# Patient Record
Sex: Male | Born: 1960 | Race: White | Hispanic: No | Marital: Single | Smoking: Never smoker
Health system: Southern US, Community
[De-identification: ages and names within clinical notes are randomized; demographics above are authoritative.]

## PROBLEM LIST (undated history)

## (undated) DIAGNOSIS — K5900 Constipation, unspecified: Secondary | ICD-10-CM

## (undated) DIAGNOSIS — K649 Unspecified hemorrhoids: Secondary | ICD-10-CM

## (undated) DIAGNOSIS — E7800 Pure hypercholesterolemia, unspecified: Secondary | ICD-10-CM

## (undated) DIAGNOSIS — I10 Essential (primary) hypertension: Secondary | ICD-10-CM

## (undated) DIAGNOSIS — K219 Gastro-esophageal reflux disease without esophagitis: Secondary | ICD-10-CM

## (undated) DIAGNOSIS — Z789 Other specified health status: Secondary | ICD-10-CM

## (undated) DIAGNOSIS — F419 Anxiety disorder, unspecified: Secondary | ICD-10-CM

## (undated) HISTORY — DX: Essential (primary) hypertension: I10

## (undated) HISTORY — PX: MINOR HEMORRHOIDECTOMY: SHX6238

## (undated) HISTORY — DX: Constipation, unspecified: K59.00

## (undated) HISTORY — PX: OTHER SURGICAL HISTORY: SHX169

---

## 2001-07-25 ENCOUNTER — Other Ambulatory Visit: Admission: RE | Admit: 2001-07-25 | Discharge: 2001-07-25 | Payer: Self-pay | Admitting: General Surgery

## 2001-10-25 ENCOUNTER — Encounter: Payer: Self-pay | Admitting: Family Medicine

## 2001-10-25 ENCOUNTER — Ambulatory Visit (HOSPITAL_COMMUNITY): Admission: RE | Admit: 2001-10-25 | Discharge: 2001-10-25 | Payer: Self-pay | Admitting: Family Medicine

## 2012-11-08 ENCOUNTER — Ambulatory Visit (INDEPENDENT_AMBULATORY_CARE_PROVIDER_SITE_OTHER): Payer: BC Managed Care – PPO | Admitting: Urology

## 2012-11-08 DIAGNOSIS — N489 Disorder of penis, unspecified: Secondary | ICD-10-CM

## 2012-11-08 DIAGNOSIS — N320 Bladder-neck obstruction: Secondary | ICD-10-CM

## 2012-11-08 DIAGNOSIS — R972 Elevated prostate specific antigen [PSA]: Secondary | ICD-10-CM

## 2012-11-18 ENCOUNTER — Encounter (INDEPENDENT_AMBULATORY_CARE_PROVIDER_SITE_OTHER): Payer: Self-pay | Admitting: *Deleted

## 2012-11-23 ENCOUNTER — Ambulatory Visit (INDEPENDENT_AMBULATORY_CARE_PROVIDER_SITE_OTHER): Payer: BC Managed Care – PPO | Admitting: Internal Medicine

## 2012-11-23 ENCOUNTER — Encounter (INDEPENDENT_AMBULATORY_CARE_PROVIDER_SITE_OTHER): Payer: Self-pay | Admitting: Internal Medicine

## 2012-11-23 ENCOUNTER — Other Ambulatory Visit (INDEPENDENT_AMBULATORY_CARE_PROVIDER_SITE_OTHER): Payer: Self-pay | Admitting: *Deleted

## 2012-11-23 ENCOUNTER — Telehealth (INDEPENDENT_AMBULATORY_CARE_PROVIDER_SITE_OTHER): Payer: Self-pay | Admitting: *Deleted

## 2012-11-23 VITALS — BP 152/80 | HR 68 | Ht 72.0 in | Wt 218.2 lb

## 2012-11-23 DIAGNOSIS — I10 Essential (primary) hypertension: Secondary | ICD-10-CM

## 2012-11-23 DIAGNOSIS — K6289 Other specified diseases of anus and rectum: Secondary | ICD-10-CM

## 2012-11-23 DIAGNOSIS — K5900 Constipation, unspecified: Secondary | ICD-10-CM

## 2012-11-23 DIAGNOSIS — R198 Other specified symptoms and signs involving the digestive system and abdomen: Secondary | ICD-10-CM

## 2012-11-23 DIAGNOSIS — Z1211 Encounter for screening for malignant neoplasm of colon: Secondary | ICD-10-CM

## 2012-11-23 MED ORDER — PEG-KCL-NACL-NASULF-NA ASC-C 100 G PO SOLR: 1.0000 | Freq: Once | ORAL | Status: DC

## 2012-11-23 NOTE — Progress Notes (Signed)
Subjective:     Patient ID: Richard Kerr, male   DOB: 04/18/1961, 52 y.o.   MRN: [id number]  HPIReferred to our office by Dr. Regino Schultze for blood in his stools and constipation.  He tells me he has hemorrhoids. He thinks something is blocking him from having a BM.  He is having small BMs.  Symptoms x 2 months.  While in the office, he presented with a bag full of stool softners. His stools are hard. When he takes the Miralax, his stools are loose. He does have rectal pain when he has a BM. He usually has 1-2 times a day in small amounts. 10/13/2012: 16.3 and 45.4, platelet ct 186 Appetite is good. No weight loss. Weight gain of 5 pounds over last few months.  Review of Systems See hpi  Current Outpatient Prescriptions  Medication Sig Dispense Refill  . docusate sodium (COLACE) 100 MG capsule Take 100 mg by mouth 2 (two) times daily.      . hydrocortisone-pramoxine (PROCTOFOAM-HC) rectal foam Place 1 applicator rectally 2 (two) times daily.      . magnesium hydroxide (MILK OF MAGNESIA) 400 MG/5ML suspension Take by mouth daily as needed for constipation.      Marland Kitchen omeprazole (PRILOSEC) 40 MG capsule Take 40 mg by mouth daily.      . polyethylene glycol (MIRALAX / GLYCOLAX) packet Take 17 g by mouth daily.       No current facility-administered medications for this visit.   Past Medical History  Diagnosis Date  . Hypertension   . Constipation    Past Medical History  Diagnosis Date  . Hypertension   . Constipation    Past Surgical History  Procedure Laterality Date  . Removal of cyst      to back   Allergies no known allergies      Objective:   Physical Exam  Filed Vitals:   11/23/12 0950  BP: 152/80  Pulse: 68  Height: 6' (1.829 m)  Weight: 218 lb 3.2 oz (98.975 kg)  Alert and oriented. Skin warm and dry. Oral mucosa is moist.   . Sclera anicteric, conjunctivae is pink. Thyroid not enlarged. No cervical lymphadenopathy. Lungs clear. Heart regular rate and rhythm.   Abdomen is soft. Bowel sounds are positive. No hepatomegaly. No abdominal masses felt. No tenderness.  No edema to lower extremities.        Assessment:    Constipation. Change in his stools. Rectal mass. Colonic neoplasm needs to be ruled out. Patient also has exernal hemorrhoids.    Plan:    Colonoscopy.  The risks and benefits such as perforation, bleeding, and infection were reviewed with the patient and is agreeable.

## 2012-11-23 NOTE — Telephone Encounter (Signed)
Patient needs movi prep 

## 2012-11-23 NOTE — Patient Instructions (Signed)
Colonoscopy with Dr. Rehman 

## 2012-11-30 ENCOUNTER — Encounter (INDEPENDENT_AMBULATORY_CARE_PROVIDER_SITE_OTHER): Payer: Self-pay

## 2012-12-02 ENCOUNTER — Ambulatory Visit (HOSPITAL_COMMUNITY)
Admission: RE | Admit: 2012-12-02 | Discharge: 2012-12-02 | Disposition: A | Discharge status: Home / Self Care | Payer: BC Managed Care – PPO | Source: Ambulatory Visit | Attending: Internal Medicine | Admitting: Internal Medicine

## 2012-12-02 ENCOUNTER — Encounter (HOSPITAL_COMMUNITY)
Admission: RE | Disposition: A | Discharge status: Home / Self Care | Payer: Self-pay | Source: Ambulatory Visit | Attending: Internal Medicine

## 2012-12-02 ENCOUNTER — Encounter (HOSPITAL_COMMUNITY): Payer: Self-pay | Admitting: *Deleted

## 2012-12-02 DIAGNOSIS — K644 Residual hemorrhoidal skin tags: Secondary | ICD-10-CM

## 2012-12-02 DIAGNOSIS — I10 Essential (primary) hypertension: Secondary | ICD-10-CM | POA: Insufficient documentation

## 2012-12-02 DIAGNOSIS — K921 Melena: Secondary | ICD-10-CM | POA: Insufficient documentation

## 2012-12-02 DIAGNOSIS — K625 Hemorrhage of anus and rectum: Secondary | ICD-10-CM

## 2012-12-02 DIAGNOSIS — K6289 Other specified diseases of anus and rectum: Secondary | ICD-10-CM

## 2012-12-02 DIAGNOSIS — K5900 Constipation, unspecified: Secondary | ICD-10-CM

## 2012-12-02 DIAGNOSIS — K602 Anal fissure, unspecified: Secondary | ICD-10-CM

## 2012-12-02 HISTORY — PX: COLONOSCOPY: SHX5424

## 2012-12-02 SURGERY — COLONOSCOPY
Anesthesia: Moderate Sedation

## 2012-12-02 MED ORDER — MEPERIDINE HCL 50 MG/ML IJ SOLN
INTRAMUSCULAR | Status: AC
  Filled 2012-12-02: qty 1

## 2012-12-02 MED ORDER — MEPERIDINE HCL 50 MG/ML IJ SOLN
INTRAMUSCULAR | Status: DC | PRN
  Administered 2012-12-02 (×2): 25 mg via INTRAVENOUS

## 2012-12-02 MED ORDER — SODIUM CHLORIDE 0.9 % IV SOLN
INTRAVENOUS | Status: DC
  Administered 2012-12-02: 1000 mL via INTRAVENOUS

## 2012-12-02 MED ORDER — MIDAZOLAM HCL 5 MG/5ML IJ SOLN
INTRAMUSCULAR | Status: DC | PRN
  Administered 2012-12-02 (×5): 2 mg via INTRAVENOUS

## 2012-12-02 MED ORDER — NITROGLYCERIN 0.3 MG SL SUBL: 0.3000 mg | SUBLINGUAL_TABLET | SUBLINGUAL | Status: DC | PRN

## 2012-12-02 MED ORDER — STERILE WATER FOR IRRIGATION IR SOLN
Status: DC | PRN
  Administered 2012-12-02: 13:00:00

## 2012-12-02 MED ORDER — PSYLLIUM 28 % PO PACK: 1.0000 | PACK | Freq: Every day | ORAL | Status: DC

## 2012-12-02 MED ORDER — DILTIAZEM GEL 2 %: 1.0000 "application " | Freq: Two times a day (BID) | CUTANEOUS | Status: DC

## 2012-12-02 MED ORDER — MIDAZOLAM HCL 5 MG/5ML IJ SOLN
INTRAMUSCULAR | Status: AC
  Filled 2012-12-02: qty 10

## 2012-12-02 NOTE — Op Note (Signed)
COLONOSCOPY PROCEDURE REPORT  PATIENT:  Richard Kerr  MR#:  [id number] Birthdate:  Dec 04, 1960, 52 y.o., male Endoscopist:  Dr. Malissa Hippo, MD Referred By:  Dr. Kirk Ruths, MD Procedure Date: 12/02/2012  Procedure:   Colonoscopy  Indications:  Patient is 52 year old Caucasian male who presents with progressive constipation and hematochezia. She also complains of bloating and intermittent chest pain. In preop he was noted to have frequent PVCs and bigeminy.  Informed Consent:  The procedure and risks were reviewed with the patient and informed consent was obtained.  Medications:  Demerol 50 mg IV Versed 10 mg IV  Description of procedure:  After a digital rectal exam was performed, that colonoscope was advanced from the anus through the rectum and colon to the area of the cecum, ileocecal valve and appendiceal orifice. The cecum was deeply intubated. These structures were well-seen and photographed for the record. From the level of the cecum and ileocecal valve, the scope was slowly and cautiously withdrawn. The mucosal surfaces were carefully surveyed utilizing scope tip to flexion to facilitate fold flattening as needed. The scope was pulled down into the rectum where a thorough exam including retroflexion was performed.  Findings:   Prep excellent. Normal mucosa of the colon. Normal rectum mucosa. Almond and hemorrhoids below the dentate line along with linear ulcer involving the anal canal. Increased anal sphincter tone.    Therapeutic/Diagnostic Maneuvers Performed:  None  Complications:  None  Cecal Withdrawal Time:  10 minutes  Impression:  Examination performed to cecum. Moderate size external hemorrhoids. Anal fissure. No evidence of rectal mass or polyps.  Frequent PVCs noted during preop evaluation as well as during the procedure.  Recommendations:  Standard instructions given. Diltiazem gel 2% one application in canal twice a day for 4  weeks. Continue high fiber diet and polyethylene glycol as before. Metamucil 4 g by mouth each bedtime. Nitroglycerin 0.3 mg sublingual when necessary chest pain. Patient's condition discussed with  Dr. Karleen Hampshire recommended cardiology consultation with Dr. Susa Griffins. If patient does not respond to medical therapy he may need internal sphincterotomy for anal fissure. Office visit in 4 weeks.  REHMAN,NAJEEB U  12/02/2012 1:42 PM  CC: Dr. Kirk Ruths, MD & Dr. Bonnetta Barry ref. provider found CC Dr. Susa Griffins, MD

## 2012-12-02 NOTE — H&P (Signed)
Richard Kerr is an 52 y.o. male.   Chief Complaint: Patient is here for colonoscopy. HPI: Patient is 52 year old Caucasian male who presents with several year history of constipation and increasing difficulty in the last 3 months. He also has noted intermittent hematochezia. He believes he has hemorrhoids. He also has noted abdominal bloating. He's been having intermittent chest pain. Has been on PPI Dr. Regino Schultze who thought he may be refluxing. He has good appetite. He has not lost any weight. In fact an ischemic component. CBC 6 weeks ago was within normal limits. Family history is negative for colorectal carcinoma.  Past Medical History  Diagnosis Date  . Hypertension   . Constipation     Past Surgical History  Procedure Laterality Date  . Removal of cyst      to back    History reviewed. No pertinent family history. Social History:  reports that he has never smoked. He does not have any smokeless tobacco history on file. He reports that he does not drink alcohol or use illicit drugs.  Allergies: No Known Allergies  Medications Prior to Admission  Medication Sig Dispense Refill  . docusate sodium (COLACE) 100 MG capsule Take 100 mg by mouth 2 (two) times daily.      . magnesium hydroxide (MILK OF MAGNESIA) 400 MG/5ML suspension Take by mouth daily as needed for constipation.      Marland Kitchen omeprazole (PRILOSEC) 40 MG capsule Take 40 mg by mouth daily.      . peg 3350 powder (MOVIPREP) 100 G SOLR Take 1 kit (100 g total) by mouth once.  1 kit  0  . polyethylene glycol (MIRALAX / GLYCOLAX) packet Take 17 g by mouth daily.      . hydrocortisone-pramoxine (PROCTOFOAM-HC) rectal foam Place 1 applicator rectally 2 (two) times daily.        No results found for this or any previous visit (from the past 48 hour(s)). No results found.  ROS  Blood pressure 135/50, pulse 102, temperature 98.6 F (37 C), temperature source Oral, resp. rate 22, height 6' (1.829 m), weight 218 lb (98.884 kg),  SpO2 98.00%. Physical Exam  Constitutional: He appears well-developed and well-nourished.  HENT:  Mouth/Throat: Oropharynx is clear and moist.  Eyes: Conjunctivae are normal. No scleral icterus.  Neck: No thyromegaly present.  Cardiovascular:  Irregular rhythm normal S1 and S2. No murmur or gallop noted. Monitoring reveals  single PVCs  Respiratory: Effort normal and breath sounds normal.  GI: Soft. He exhibits no distension and no mass. There is no tenderness.  Musculoskeletal: He exhibits no edema.  Lymphadenopathy:    He has no cervical adenopathy.  Neurological: He is alert.  Skin: Skin is warm and dry.     Assessment/Plan Constipation and rectal bleeding. Diagnostic colonoscopy.  Matie Dimaano U 12/02/2012, 12:57 PM

## 2012-12-06 ENCOUNTER — Other Ambulatory Visit (HOSPITAL_COMMUNITY): Payer: Self-pay | Admitting: Cardiovascular Disease

## 2012-12-06 ENCOUNTER — Encounter (HOSPITAL_COMMUNITY): Payer: Self-pay | Admitting: Internal Medicine

## 2012-12-06 DIAGNOSIS — I493 Ventricular premature depolarization: Secondary | ICD-10-CM

## 2012-12-06 DIAGNOSIS — R011 Cardiac murmur, unspecified: Secondary | ICD-10-CM

## 2012-12-13 ENCOUNTER — Ambulatory Visit (HOSPITAL_COMMUNITY)
Admission: RE | Admit: 2012-12-13 | Discharge: 2012-12-13 | Disposition: A | Discharge status: Home / Self Care | Payer: BC Managed Care – PPO | Source: Ambulatory Visit | Attending: Cardiovascular Disease | Admitting: Cardiovascular Disease

## 2012-12-13 DIAGNOSIS — R011 Cardiac murmur, unspecified: Secondary | ICD-10-CM | POA: Insufficient documentation

## 2012-12-13 DIAGNOSIS — R079 Chest pain, unspecified: Secondary | ICD-10-CM | POA: Insufficient documentation

## 2012-12-13 DIAGNOSIS — I498 Other specified cardiac arrhythmias: Secondary | ICD-10-CM | POA: Insufficient documentation

## 2012-12-13 DIAGNOSIS — I4949 Other premature depolarization: Secondary | ICD-10-CM | POA: Insufficient documentation

## 2012-12-13 DIAGNOSIS — I493 Ventricular premature depolarization: Secondary | ICD-10-CM

## 2012-12-13 NOTE — Progress Notes (Signed)
2D Echo Performed 12/13/2012    Clearence Ped, RCS

## 2012-12-23 ENCOUNTER — Other Ambulatory Visit: Payer: Self-pay | Admitting: Urology

## 2012-12-23 DIAGNOSIS — R972 Elevated prostate specific antigen [PSA]: Secondary | ICD-10-CM

## 2012-12-26 ENCOUNTER — Encounter (INDEPENDENT_AMBULATORY_CARE_PROVIDER_SITE_OTHER): Payer: Self-pay | Admitting: Internal Medicine

## 2012-12-26 ENCOUNTER — Encounter: Payer: Self-pay | Admitting: Cardiovascular Disease

## 2012-12-26 ENCOUNTER — Encounter (HOSPITAL_COMMUNITY): Payer: Self-pay | Admitting: Cardiovascular Disease

## 2012-12-26 ENCOUNTER — Other Ambulatory Visit (HOSPITAL_COMMUNITY): Payer: Self-pay | Admitting: Cardiovascular Disease

## 2012-12-26 ENCOUNTER — Ambulatory Visit (INDEPENDENT_AMBULATORY_CARE_PROVIDER_SITE_OTHER): Payer: BC Managed Care – PPO | Admitting: Internal Medicine

## 2012-12-26 VITALS — BP 134/70 | HR 42 | Temp 99.3°F | Ht 72.0 in | Wt 216.5 lb

## 2012-12-26 DIAGNOSIS — K602 Anal fissure, unspecified: Secondary | ICD-10-CM

## 2012-12-26 DIAGNOSIS — R011 Cardiac murmur, unspecified: Secondary | ICD-10-CM

## 2012-12-26 DIAGNOSIS — I493 Ventricular premature depolarization: Secondary | ICD-10-CM

## 2012-12-26 NOTE — Progress Notes (Signed)
Subjective:     Patient ID: Richard Kerr, male   DOB: June 11, 1961, 52 y.o.   MRN: [id number]  HPI last seen in April for constipation and possible rectal mass which was actually an rectal fissure. He had had symptoms x 2 months. Underwent a colonoscopy and found to have a anal fissure. He tells me he still has the symptoms of constipation. He says he has a blockage.  He has been taking the Diltiazem gel 2% 1-2 times a day.  He tells me he feels like he is blocked. There is some rectal bleeding. Stools are small.  He still has tenderness in his rectum.  He is not using Miralax or Metamucil as recommended by Dr. Karilyn Cota  No weight loss.  Appetite is good.  Presently taking Metoprolol for PVCs noted during his colonoscopy. Sees Dr. Alanda Amass.     11/2012 Colonoscopy: Impression:  Examination performed to cecum.  Moderate size external hemorrhoids.  Anal fissure.  No evidence of rectal mass or polyps.  Frequent PVCs noted during preop evaluation as well as during the procedure.  Recommendations:  Standard instructions given.  Diltiazem gel 2% one application in canal twice a day for 4 weeks.  Continue high fiber diet and polyethylene glycol as before.  Metamucil 4 g by mouth each bedtime.  Nitroglycerin 0.3 mg sublingual when necessary chest pain.  Patient's condition discussed with Dr. Karleen Hampshire recommended cardiology consultation with Dr. Susa Griffins.  If patient does not respond to medical therapy he may need internal sphincterotomy for anal fissure.  Office visit in 4 weeks.   Review of Systems See hpi Current Outpatient Prescriptions  Medication Sig Dispense Refill  . diltiazem 2 % GEL Apply 1 application topically 2 (two) times daily.  60 g  1  . metoprolol tartrate (LOPRESSOR) 25 MG tablet Take 25 mg by mouth daily.      . nitroGLYCERIN (NITROSTAT) 0.3 MG SL tablet Place 1 tablet (0.3 mg total) under the tongue every 5 (five) minutes as needed for chest pain.  100  tablet  0  . simvastatin (ZOCOR) 20 MG tablet Take 20 mg by mouth every evening.       No current facility-administered medications for this visit.   Past Medical History  Diagnosis Date  . Hypertension   . Constipation        Objective:   Physical Exam  Filed Vitals:   12/26/12 1403  BP: 134/70  Pulse: 42  Temp: 99.3 F (37.4 C)  Height: 6' (1.829 m)  Weight: 216 lb 8 oz (98.204 kg)   Alert and oriented. Skin warm and dry. Oral mucosa is moist.   . Sclera anicteric, conjunctivae is pink. Thyroid not enlarged. No cervical lymphadenopathy. Lungs clear. Heart regular rate and rhythm.  Abdomen is soft. Bowel sounds are positive. No hepatomegaly. No abdominal masses felt. No tenderness.  No edema to lower extremities. Rectal exam: noted to have tight anal sphincter. No anal fissure felt.     Assessment:    Anal fissure: hopefully healed. Constipation    Plan:    Referral to Dr. Malvin Johns for possible sphincterotomy. Start the Miralax 1 scoop and Metamucil 4gms dailly.

## 2012-12-26 NOTE — Patient Instructions (Addendum)
Miralax 1 scoop daily. Metamucil 4gm daily.

## 2012-12-27 ENCOUNTER — Encounter: Payer: Self-pay | Admitting: Cardiovascular Disease

## 2013-01-03 ENCOUNTER — Other Ambulatory Visit: Payer: Self-pay | Admitting: Urology

## 2013-01-03 ENCOUNTER — Ambulatory Visit (HOSPITAL_COMMUNITY)
Admission: RE | Admit: 2013-01-03 | Discharge: 2013-01-03 | Disposition: A | Discharge status: Home / Self Care | Payer: BC Managed Care – PPO | Source: Ambulatory Visit | Attending: Urology | Admitting: Urology

## 2013-01-03 DIAGNOSIS — R972 Elevated prostate specific antigen [PSA]: Secondary | ICD-10-CM

## 2013-01-03 MED ORDER — LIDOCAINE HCL (PF) 2 % IJ SOLN
INTRAMUSCULAR | Status: AC
  Filled 2013-01-03: qty 10

## 2013-01-03 MED ORDER — GENTAMICIN SULFATE 40 MG/ML IJ SOLN
160.0000 mg | Freq: Once | INTRAMUSCULAR | Status: DC
  Filled 2013-01-03: qty 4

## 2013-01-03 NOTE — Progress Notes (Signed)
Gentamicin 160mg  given IM in left deltoid. Band aid appiled.

## 2013-01-03 NOTE — Progress Notes (Signed)
Lidocaine 2%       10mL injected                       Transrectal prostate biopsies performed 

## 2013-01-05 ENCOUNTER — Ambulatory Visit (HOSPITAL_COMMUNITY)
Admission: RE | Admit: 2013-01-05 | Discharge: 2013-01-05 | Disposition: A | Discharge status: Home / Self Care | Payer: BC Managed Care – PPO | Source: Ambulatory Visit | Attending: Cardiovascular Disease | Admitting: Cardiovascular Disease

## 2013-01-05 DIAGNOSIS — I4949 Other premature depolarization: Secondary | ICD-10-CM | POA: Insufficient documentation

## 2013-01-05 DIAGNOSIS — I493 Ventricular premature depolarization: Secondary | ICD-10-CM

## 2013-01-05 DIAGNOSIS — R011 Cardiac murmur, unspecified: Secondary | ICD-10-CM | POA: Insufficient documentation

## 2013-01-11 ENCOUNTER — Encounter: Payer: Self-pay | Admitting: Urology

## 2013-01-26 ENCOUNTER — Other Ambulatory Visit: Payer: Self-pay | Admitting: Urology

## 2013-02-14 ENCOUNTER — Encounter (HOSPITAL_COMMUNITY): Payer: Self-pay

## 2013-02-14 ENCOUNTER — Encounter (HOSPITAL_COMMUNITY)
Admission: RE | Admit: 2013-02-14 | Discharge: 2013-02-14 | Disposition: A | Discharge status: Home / Self Care | Payer: BC Managed Care – PPO | Source: Ambulatory Visit | Attending: Urology | Admitting: Urology

## 2013-02-14 HISTORY — DX: Pure hypercholesterolemia, unspecified: E78.00

## 2013-02-14 HISTORY — DX: Unspecified hemorrhoids: K64.9

## 2013-02-14 HISTORY — DX: Gastro-esophageal reflux disease without esophagitis: K21.9

## 2013-02-14 HISTORY — DX: Other specified health status: Z78.9

## 2013-02-14 LAB — HEMOGLOBIN AND HEMATOCRIT, BLOOD: Hemoglobin: 15.9 g/dL (ref 13.0–17.0)

## 2013-02-14 LAB — BASIC METABOLIC PANEL
BUN: 13 mg/dL (ref 6–23)
Chloride: 106 mEq/L (ref 96–112)
Creatinine, Ser: 1.2 mg/dL (ref 0.50–1.35)
GFR calc Af Amer: 79 mL/min — ABNORMAL LOW (ref >90)
GFR calc non Af Amer: 68 mL/min — ABNORMAL LOW (ref >90)

## 2013-02-14 NOTE — Patient Instructions (Addendum)
JOVEN MOM  02/14/2013   Your procedure is scheduled on:  Tuesday, 02/21/13  Report to [facility] at 1045 AM.  Call this number if you have problems the morning of surgery: [phone]   Remember:   Do not eat food or drink liquids after midnight.   Take these medicines the morning of surgery with A SIP OF WATER: metoprolol   Do not wear jewelry, make-up or nail polish.  Do not wear lotions, powders, or perfumes. You may wear deodorant.  Do not shave 48 hours prior to surgery. Men may shave face and neck.  Do not bring valuables to the hospital.  [facility] is not responsible                   for any belongings or valuables.  Contacts, dentures or bridgework may not be worn into surgery.  Leave suitcase in the car. After surgery it may be brought to your room.  For patients admitted to the hospital, checkout time is 11:00 AM the day of  discharge.   Patients discharged the day of surgery will not be allowed to drive  home.  Name and phone number of your driver: family  Special Instructions: Shower using CHG 2 nights before surgery and the night before surgery.  If you shower the day of surgery use CHG.  Use special wash - you have one bottle of CHG for all showers.  You should use approximately 1/3 of the bottle for each shower.   Please read over the following fact sheets that you were given: Anesthesia Post-op Instructions   Biopsy Care After Refer to this sheet in the next few weeks. These instructions provide you with information on caring for yourself after your procedure. Your caregiver may also give you more specific instructions. Your treatment has been planned according to current medical practices, but problems sometimes occur. Call your caregiver if you have any problems or questions after your procedure. If you had a fine needle biopsy, you may have soreness at the biopsy site for 1 to 2 days. If you had an open biopsy, you may have soreness at the biopsy site for 3 to 4  days. HOME CARE INSTRUCTIONS   You may resume normal diet and activities as directed.  Change bandages (dressings) as directed. If your wound was closed with a skin glue (adhesive), it will wear off and begin to peel in 7 days.  Only take over-the-counter or prescription medicines for pain, discomfort, or fever as directed by your caregiver.  Ask your caregiver when you can bathe and get your wound wet. SEEK IMMEDIATE MEDICAL CARE IF:   You have increased bleeding (more than a small spot) from the biopsy site.  You notice redness, swelling, or increasing pain at the biopsy site.  You have pus coming from the biopsy site.  You have a fever.  You notice a bad smell coming from the biopsy site or dressing.  You have a rash, have difficulty breathing, or have any allergic problems. MAKE SURE YOU:   Understand these instructions.  Will watch your condition.  Will get help right away if you are not doing well or get worse. Document Released: 01/30/2005 Document Revised: 10/05/2011 Document Reviewed: 01/08/2011 [facility] Patient Information 2014 [place], [place]. PATIENT INSTRUCTIONS POST-ANESTHESIA  IMMEDIATELY FOLLOWING SURGERY:  Do not drive or operate machinery for the first twenty four hours after surgery.  Do not make any important decisions for twenty four hours after surgery or while  taking narcotic pain medications or sedatives.  If you develop intractable nausea and vomiting or a severe headache please notify your doctor immediately.  FOLLOW-UP:  Please make an appointment with your surgeon as instructed. You do not need to follow up with anesthesia unless specifically instructed to do so.  WOUND CARE INSTRUCTIONS (if applicable):  Keep a dry clean dressing on the anesthesia/puncture wound site if there is drainage.  Once the wound has quit draining you may leave it open to air.  Generally you should leave the bandage intact for twenty four hours unless there is drainage.  If  the epidural site drains for more than 36-48 hours please call the anesthesia department.  QUESTIONS?:  Please feel free to call your physician or the hospital operator if you have any questions, and they will be happy to assist you.

## 2013-02-21 ENCOUNTER — Ambulatory Visit (HOSPITAL_COMMUNITY)
Admission: RE | Admit: 2013-02-21 | Discharge: 2013-02-21 | Disposition: A | Discharge status: Home / Self Care | Payer: BC Managed Care – PPO | Source: Ambulatory Visit | Attending: Urology | Admitting: Urology

## 2013-02-21 ENCOUNTER — Encounter (HOSPITAL_COMMUNITY): Payer: Self-pay | Admitting: Anesthesiology

## 2013-02-21 ENCOUNTER — Encounter (HOSPITAL_COMMUNITY): Payer: Self-pay | Admitting: *Deleted

## 2013-02-21 ENCOUNTER — Ambulatory Visit (HOSPITAL_COMMUNITY): Payer: BC Managed Care – PPO | Admitting: Anesthesiology

## 2013-02-21 ENCOUNTER — Encounter (HOSPITAL_COMMUNITY)
Admission: RE | Disposition: A | Discharge status: Home / Self Care | Payer: Self-pay | Source: Ambulatory Visit | Attending: Urology

## 2013-02-21 DIAGNOSIS — A630 Anogenital (venereal) warts: Secondary | ICD-10-CM | POA: Insufficient documentation

## 2013-02-21 DIAGNOSIS — K5900 Constipation, unspecified: Secondary | ICD-10-CM | POA: Insufficient documentation

## 2013-02-21 DIAGNOSIS — N489 Disorder of penis, unspecified: Secondary | ICD-10-CM

## 2013-02-21 DIAGNOSIS — I10 Essential (primary) hypertension: Secondary | ICD-10-CM | POA: Insufficient documentation

## 2013-02-21 DIAGNOSIS — N508 Other specified disorders of male genital organs: Secondary | ICD-10-CM | POA: Insufficient documentation

## 2013-02-21 DIAGNOSIS — L989 Disorder of the skin and subcutaneous tissue, unspecified: Secondary | ICD-10-CM | POA: Insufficient documentation

## 2013-02-21 DIAGNOSIS — K649 Unspecified hemorrhoids: Secondary | ICD-10-CM | POA: Insufficient documentation

## 2013-02-21 DIAGNOSIS — K219 Gastro-esophageal reflux disease without esophagitis: Secondary | ICD-10-CM | POA: Insufficient documentation

## 2013-02-21 DIAGNOSIS — N423 Unspecified dysplasia of prostate: Secondary | ICD-10-CM | POA: Insufficient documentation

## 2013-02-21 DIAGNOSIS — E7800 Pure hypercholesterolemia, unspecified: Secondary | ICD-10-CM | POA: Insufficient documentation

## 2013-02-21 HISTORY — PX: PENILE BIOPSY: SHX6013

## 2013-02-21 SURGERY — BIOPSY, PENIS
Anesthesia: Monitor Anesthesia Care | Site: Penis | Wound class: Clean

## 2013-02-21 MED ORDER — CEFAZOLIN SODIUM 1-5 GM-% IV SOLN
INTRAVENOUS | Status: AC
  Filled 2013-02-21: qty 50

## 2013-02-21 MED ORDER — 0.9 % SODIUM CHLORIDE (POUR BTL) OPTIME
TOPICAL | Status: DC | PRN
  Administered 2013-02-21: 500 mL

## 2013-02-21 MED ORDER — LIDOCAINE HCL 1 % IJ SOLN
INTRAMUSCULAR | Status: DC | PRN
  Administered 2013-02-21: 6.5 mL

## 2013-02-21 MED ORDER — FENTANYL CITRATE 0.05 MG/ML IJ SOLN
INTRAMUSCULAR | Status: AC
  Filled 2013-02-21: qty 2

## 2013-02-21 MED ORDER — MIDAZOLAM HCL 2 MG/2ML IJ SOLN
1.0000 mg | INTRAMUSCULAR | Status: DC | PRN
  Administered 2013-02-21: 2 mg via INTRAVENOUS

## 2013-02-21 MED ORDER — LIDOCAINE HCL (PF) 1 % IJ SOLN
INTRAMUSCULAR | Status: AC
  Filled 2013-02-21: qty 5

## 2013-02-21 MED ORDER — LACTATED RINGERS IV SOLN
INTRAVENOUS | Status: DC
  Administered 2013-02-21: 11:00:00 via INTRAVENOUS

## 2013-02-21 MED ORDER — HYDROCODONE-ACETAMINOPHEN 5-325 MG PO TABS: 1.0000 | ORAL_TABLET | ORAL | Status: DC | PRN

## 2013-02-21 MED ORDER — BUPIVACAINE HCL (PF) 0.25 % IJ SOLN
INTRAMUSCULAR | Status: AC
  Filled 2013-02-21: qty 30

## 2013-02-21 MED ORDER — FENTANYL CITRATE 0.05 MG/ML IJ SOLN: 25.0000 ug | INTRAMUSCULAR | Status: DC | PRN

## 2013-02-21 MED ORDER — MIDAZOLAM HCL 5 MG/5ML IJ SOLN
INTRAMUSCULAR | Status: DC | PRN
  Administered 2013-02-21: 2 mg via INTRAVENOUS

## 2013-02-21 MED ORDER — PROPOFOL INFUSION 10 MG/ML OPTIME
INTRAVENOUS | Status: DC | PRN
  Administered 2013-02-21: 125 ug/kg/min via INTRAVENOUS

## 2013-02-21 MED ORDER — MIDAZOLAM HCL 2 MG/2ML IJ SOLN
INTRAMUSCULAR | Status: AC
  Filled 2013-02-21: qty 2

## 2013-02-21 MED ORDER — FENTANYL CITRATE 0.05 MG/ML IJ SOLN
25.0000 ug | INTRAMUSCULAR | Status: AC
  Administered 2013-02-21 (×2): 25 ug via INTRAVENOUS

## 2013-02-21 MED ORDER — CEFAZOLIN SODIUM 1-5 GM-% IV SOLN
1.0000 g | INTRAVENOUS | Status: AC
  Administered 2013-02-21: 1 g via INTRAVENOUS

## 2013-02-21 MED ORDER — PROPOFOL 10 MG/ML IV EMUL
INTRAVENOUS | Status: AC
  Filled 2013-02-21: qty 20

## 2013-02-21 MED ORDER — LACTATED RINGERS IV SOLN
INTRAVENOUS | Status: DC | PRN
  Administered 2013-02-21: 11:00:00 via INTRAVENOUS

## 2013-02-21 MED ORDER — FENTANYL CITRATE 0.05 MG/ML IJ SOLN
INTRAMUSCULAR | Status: DC | PRN
  Administered 2013-02-21 (×2): 25 ug via INTRAVENOUS

## 2013-02-21 MED ORDER — ONDANSETRON HCL 4 MG/2ML IJ SOLN: 4.0000 mg | Freq: Once | INTRAMUSCULAR | Status: DC | PRN

## 2013-02-21 MED ORDER — LIDOCAINE HCL (PF) 1 % IJ SOLN
INTRAMUSCULAR | Status: AC
  Filled 2013-02-21: qty 30

## 2013-02-21 MED ORDER — BUPIVACAINE HCL 0.25 % IJ SOLN
INTRAMUSCULAR | Status: DC | PRN
  Administered 2013-02-21: 6.5 mL

## 2013-02-21 SURGICAL SUPPLY — 26 items
BAG HAMPER (MISCELLANEOUS) ×2 IMPLANT
BANDAGE CONFORM 2  STR LF (GAUZE/BANDAGES/DRESSINGS) ×2 IMPLANT
BLADE SURG 15 STRL LF DISP TIS (BLADE) ×1 IMPLANT
BLADE SURG 15 STRL SS (BLADE) ×1
CLOTH BEACON ORANGE TIMEOUT ST (SAFETY) ×2 IMPLANT
ELECT NEEDLE TIP 2.8 STRL (NEEDLE) IMPLANT
ELECT REM PT RETURN 9FT ADLT (ELECTROSURGICAL) ×2
ELECTRODE REM PT RTRN 9FT ADLT (ELECTROSURGICAL) ×1 IMPLANT
GAUZE KERLIX 2  STERILE LF (GAUZE/BANDAGES/DRESSINGS) IMPLANT
GAUZE PETROLATUM 1 X8 (GAUZE/BANDAGES/DRESSINGS) ×2 IMPLANT
GLOVE BIO SURGEON STRL SZ8 (GLOVE) ×2 IMPLANT
GLOVE EXAM NITRILE MD LF STRL (GLOVE) ×2 IMPLANT
GLOVE INDICATOR 7.0 STRL GRN (GLOVE) ×2 IMPLANT
GLOVE SS BIOGEL STRL SZ 6.5 (GLOVE) ×1 IMPLANT
GLOVE SUPERSENSE BIOGEL SZ 6.5 (GLOVE) ×1
GOWN STRL REIN XL XLG (GOWN DISPOSABLE) ×4 IMPLANT
KIT ROOM TURNOVER APOR (KITS) ×2 IMPLANT
MANIFOLD NEPTUNE II (INSTRUMENTS) ×2 IMPLANT
NEEDLE HYPO 22GX1.5 SAFETY (NEEDLE) ×2 IMPLANT
NEEDLE HYPO 25X5/8 SAFETYGLIDE (NEEDLE) IMPLANT
NS IRRIG 500ML POUR BTL (IV SOLUTION) ×2 IMPLANT
PACK MINOR (CUSTOM PROCEDURE TRAY) ×2 IMPLANT
SET BASIN LINEN APH (SET/KITS/TRAYS/PACK) ×2 IMPLANT
SUT CHROMIC 4 0 RB 1X27 (SUTURE) ×2 IMPLANT
SUT CHROMIC 5 0 RB 1 27 (SUTURE) IMPLANT
SYR CONTROL 10ML LL (SYRINGE) ×2 IMPLANT

## 2013-02-21 NOTE — Op Note (Signed)
Preoperative diagnosis: Penile lesion, 8 mm  Postoperative diagnosis: Same   Procedure: Excision of he now lesion    Surgeon: Bertram Millard. Claudie Brickhouse, M.D.   Anesthesia: Local with sedation  Complications: None  Specimen(s): Penile lesion  Drain(s): None  Indications: 52 year-old male with a fairly large keratinized lesion in his frenular area of the distal penile skin. He presents at this time for excision. This is been present for quite some time. The patient desires removal.    Technique and findings: The patient was properly identified in the holding area. He received preoperative IV antibiotics and sedation. He was taken to the operating room where he was administered further sedation, and placed in the recumbent position. Penis was prepped and draped. Proper timeout was then performed. I then infiltrated the frenular area with approximately 3 cc of a 50-50 solution of quarter percent plain Marcaine and 1% lidocaine. A penile block was also performed.  I then excised the penile lesion with fairly wide margins inj an elliptical fashion circumferentially. I felt that I got a good deep margin is well. Following this, the deep margin was then cauterized, stopping the small amount of bleeding. Following adequate hemostasis, I closed the wound with 4-0 chromic placed in a horizontal mattress fashion. Hemostasis was adequate at this point. A dressing of Vaseline gauze and a gauze wrap was then placed. The patient tolerated procedure well and was taken to PACU in stable condition.

## 2013-02-21 NOTE — Progress Notes (Signed)
Awake. Denies pain. Refuses po fluids. 

## 2013-02-21 NOTE — Preoperative (Signed)
Beta Blockers   Reason not to administer Beta Blockers:Not Applicable 

## 2013-02-21 NOTE — Anesthesia Preprocedure Evaluation (Signed)
Anesthesia Evaluation  Patient identified by MRN, date of birth, ID band Patient awake    Reviewed: Allergy & Precautions, H&P , NPO status , Patient's Chart, lab work & pertinent test results, reviewed documented beta blocker date and time   Airway Mallampati: I TM Distance: >3 FB     Dental   Pulmonary neg pulmonary ROS,  breath sounds clear to auscultation        Cardiovascular hypertension, Pt. on home beta blockers and Pt. on medications Rhythm:Regular Rate:Normal     Neuro/Psych    GI/Hepatic GERD-  Medicated and Controlled,  Endo/Other    Renal/GU      Musculoskeletal   Abdominal   Peds  Hematology   Anesthesia Other Findings   Reproductive/Obstetrics                           Anesthesia Physical Anesthesia Plan  ASA: II  Anesthesia Plan: MAC   Post-op Pain Management:    Induction: Intravenous  Airway Management Planned: Simple Face Mask  Additional Equipment:   Intra-op Plan:   Post-operative Plan:   Informed Consent: I have reviewed the patients History and Physical, chart, labs and discussed the procedure including the risks, benefits and alternatives for the proposed anesthesia with the patient or authorized representative who has indicated his/her understanding and acceptance.     Plan Discussed with:   Anesthesia Plan Comments:         Anesthesia Quick Evaluation

## 2013-02-21 NOTE — Anesthesia Procedure Notes (Signed)
Procedure Name: MAC Date/Time: 02/21/2013 12:16 PM Performed by: Carolyne Littles, Alp Goldwater L Pre-anesthesia Checklist: Patient identified, Timeout performed, Emergency Drugs available, Suction available and Patient being monitored Oxygen Delivery Method: Non-rebreather mask

## 2013-02-21 NOTE — Transfer of Care (Signed)
Immediate Anesthesia Transfer of Care Note  Patient: Richard Kerr  Procedure(s) Performed: Procedure(s): PENILE BIOPSY (N/A)  Patient Location: PACU  Anesthesia Type:MAC  Level of Consciousness: sedated and patient cooperative  Airway & Oxygen Therapy: Patient Spontanous Breathing and Patient connected to face mask oxygen  Post-op Assessment: Report given to PACU RN and Post -op Vital signs reviewed and stable  Post vital signs: Reviewed and stable  Complications: No apparent anesthesia complications

## 2013-02-21 NOTE — H&P (Signed)
Urology History and Physical Exam  CC: Growth on penis  HPI: 52 year old male presents for excision of a penile lesion. I have seen him in the office and U/S suite here @ [facility] for elevated PSA. 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 He also has a 7-8 mm keratinized lesion on his distal penis that will be excised today.  PMH: Past Medical History  Diagnosis Date  . Hypertension   . Constipation   . Hemorrhoids   . GERD (gastroesophageal reflux disease)   . Hypercholesteremia   . Poor historian     PSH: Past Surgical History  Procedure Laterality Date  . Removal of cyst      to back  . Colonoscopy N/A 12/02/2012    Procedure: COLONOSCOPY;  Surgeon: Malissa Hippo, MD;  Location: [facility] END[facility] SUITE;  Service: Endoscopy;  Laterality: N/A;  210-moved to 12:55 Ann to notify pt  . Minor hemorrhoidectomy      Dr. Malvin Johns    Allergies: No Known Allergies  Medications: No prescriptions prior to admission     Social History: History   Social History  . Marital Status: Single    Spouse Name: N/A    Number of Children: N/A  . Years of Education: N/A   Occupational History  . Not on file.   Social History Main Topics  . Smoking status: Never Smoker   . Smokeless tobacco: Not on file  . Alcohol Use: No  . Drug Use: No  . Sexually Active: Not on file   Other Topics Concern  . Not on file   Social History Narrative  . No narrative on file    Family History: No family history on file.  Review of Systems: Genitourinary, constitutional, skin, eye, otolaryngeal, hematologic/lymphatic, cardiovascular, pulmonary, endocrine, musculoskeletal, gastrointestinal, neurological and psychiatric system(s) were reviewed and pertinent findings if present are noted.  Genitourinary: urinary frequency, nocturia, weak urinary stream and urinary stream starts and stops.  Integumentary: skin rash/lesion and pruritus.   Physical  Exam: @VITALS2 @ Constitutional: Well nourished and well developed . No acute distress.  ENT:. The ears and nose are normal in appearance.  Neck: The appearance of the neck is normal and no neck mass is present.  Pulmonary: No respiratory distress and normal respiratory rhythm and effort.  Cardiovascular: Heart rate and rhythm are normal . No peripheral edema.  Abdomen: The abdomen is mildly obese. The abdomen is soft and nontender. No masses are palpated. No CVA tenderness. No hernias are palpable. No hepatosplenomegaly noted.  Rectal: Rectal exam demonstrates normal sphincter tone, the anus is normal on inspection., no tenderness and no masses. Prostate size is estimated to be 50 g. Normal rectal tone, no rectal masses, prostate is smooth, symmetric and non-tender. The prostate has no nodularity and is not tender. The left seminal vesicle is nonpalpable. The right seminal vesicle is nonpalpable. The perineum is normal on inspection.  Genitourinary: Examination of the penis demonstrates no discharge, no masses and a normal meatus. The penis is circumcised. Penile lesion: A single 0.8 cm ulcer(s) noted. This is just proximal to the frenulum and seems keratinized. The scrotum is normal in appearance and without lesions. The right epididymis is palpably normal and non-tender. The left epididymis is palpably normal and non-tender. The right testis is palpably normal, non-tender and without masses. The left testis is normal, non-tender and without masses.  Lymphatics: The femoral and inguinal nodes are not enlarged or tender.  Skin: Normal skin turgor, no visible rash and no visible skin lesions.  Neuro/Psych:. Mood and affect are appropriate. Studies:  No results found for this basename: HGB, WBC, PLT,  in the last 72 hours  No results found for this basename: NA, K, CL, CO2, BUN, CREATININE, CALCIUM, MAGNESIUM, GFRNONAA, GFRAA,  in the last 72 hours   No results found for this basename: PT, INR,  APTT,  in the last 72 hours   No components found with this basename: ABG,     Assessment:  Penile lesion  Plan: Excision of penile lesion

## 2013-02-21 NOTE — Anesthesia Postprocedure Evaluation (Signed)
  Anesthesia Post-op Note  Patient: Richard Kerr  Procedure(s) Performed: Procedure(s): PENILE BIOPSY (N/A)  Patient Location: PACU  Anesthesia Type:MAC  Level of Consciousness: sedated and patient cooperative  Airway and Oxygen Therapy: Patient Spontanous Breathing and Patient connected to face mask oxygen  Post-op Pain: mild  Post-op Assessment: Post-op Vital signs reviewed, Patient's Cardiovascular Status Stable, Respiratory Function Stable, Patent Airway, No signs of Nausea or vomiting and Pain level controlled  Post-op Vital Signs: Reviewed and stable  Complications: No apparent anesthesia complications

## 2013-02-23 ENCOUNTER — Encounter (HOSPITAL_COMMUNITY): Payer: Self-pay | Admitting: Urology

## 2013-03-28 ENCOUNTER — Ambulatory Visit (INDEPENDENT_AMBULATORY_CARE_PROVIDER_SITE_OTHER): Payer: BC Managed Care – PPO | Admitting: Urology

## 2013-03-28 DIAGNOSIS — N320 Bladder-neck obstruction: Secondary | ICD-10-CM

## 2013-03-28 DIAGNOSIS — N489 Disorder of penis, unspecified: Secondary | ICD-10-CM

## 2013-03-28 DIAGNOSIS — R972 Elevated prostate specific antigen [PSA]: Secondary | ICD-10-CM

## 2013-03-31 ENCOUNTER — Other Ambulatory Visit: Payer: Self-pay | Admitting: Cardiovascular Disease

## 2013-03-31 LAB — CBC WITH DIFFERENTIAL/PLATELET
Eosinophils Absolute: 0.1 10*3/uL (ref 0.0–0.7)
HCT: 45.1 % (ref 39.0–52.0)
Hemoglobin: 15.7 g/dL (ref 13.0–17.0)
Lymphs Abs: 1.6 10*3/uL (ref 0.7–4.0)
MCH: 29.7 pg (ref 26.0–34.0)
MCV: 85.3 fL (ref 78.0–100.0)
Monocytes Relative: 7 % (ref 3–12)
Neutrophils Relative %: 64 % (ref 43–77)
RBC: 5.29 MIL/uL (ref 4.22–5.81)

## 2013-03-31 LAB — LIPID PANEL
Cholesterol: 125 mg/dL (ref 0–200)
Triglycerides: 96 mg/dL (ref <150)
VLDL: 19 mg/dL (ref 0–40)

## 2013-03-31 LAB — COMPREHENSIVE METABOLIC PANEL
Albumin: 4.5 g/dL (ref 3.5–5.2)
CO2: 25 mEq/L (ref 19–32)
Glucose, Bld: 91 mg/dL (ref 70–99)
Sodium: 140 mEq/L (ref 135–145)
Total Bilirubin: 0.6 mg/dL (ref 0.3–1.2)
Total Protein: 7 g/dL (ref 6.0–8.3)

## 2013-08-18 ENCOUNTER — Ambulatory Visit (HOSPITAL_COMMUNITY)
Admission: RE | Admit: 2013-08-18 | Discharge: 2013-08-18 | Disposition: A | Discharge status: Home / Self Care | Payer: BC Managed Care – PPO | Source: Ambulatory Visit | Attending: Family Medicine | Admitting: Family Medicine

## 2013-08-18 ENCOUNTER — Other Ambulatory Visit (HOSPITAL_COMMUNITY): Payer: Self-pay | Admitting: Family Medicine

## 2013-08-18 DIAGNOSIS — R109 Unspecified abdominal pain: Secondary | ICD-10-CM

## 2015-02-14 ENCOUNTER — Encounter: Payer: Self-pay | Admitting: *Deleted

## 2015-03-21 ENCOUNTER — Encounter: Payer: Self-pay | Admitting: Cardiovascular Disease

## 2015-07-24 NOTE — Progress Notes (Unsigned)
B/P: 143/87 taken by Janan RidgeGinger Vaness RN

## 2017-06-09 ENCOUNTER — Emergency Department (HOSPITAL_COMMUNITY)
Admission: EM | Admit: 2017-06-09 | Discharge: 2017-06-09 | Disposition: A | Discharge status: Home / Self Care | Payer: BLUE CROSS/BLUE SHIELD | Attending: Emergency Medicine | Admitting: Emergency Medicine

## 2017-06-09 ENCOUNTER — Encounter (HOSPITAL_COMMUNITY): Payer: Self-pay

## 2017-06-09 DIAGNOSIS — M79644 Pain in right finger(s): Secondary | ICD-10-CM | POA: Diagnosis present

## 2017-06-09 DIAGNOSIS — I10 Essential (primary) hypertension: Secondary | ICD-10-CM | POA: Diagnosis not present

## 2017-06-09 DIAGNOSIS — L03012 Cellulitis of left finger: Secondary | ICD-10-CM | POA: Insufficient documentation

## 2017-06-09 DIAGNOSIS — Z79899 Other long term (current) drug therapy: Secondary | ICD-10-CM | POA: Insufficient documentation

## 2017-06-09 MED ORDER — CEPHALEXIN 500 MG PO CAPS: 500.0000 mg | ORAL_CAPSULE | Freq: Four times a day (QID) | ORAL | 0 refills | Status: DC

## 2017-06-09 MED ORDER — HYDROCODONE-ACETAMINOPHEN 5-325 MG PO TABS: 1.0000 | ORAL_TABLET | ORAL | 0 refills | Status: DC | PRN

## 2017-06-09 NOTE — ED Triage Notes (Signed)
Pt complaining of left thumb pain. Pt states he tried to pick a hang nail off left thumb 2 weeks ago, but has been progressively picking at thumb since then. Thumb is swollen and discolored. Rates pain as 10/10

## 2017-06-09 NOTE — Discharge Instructions (Signed)
As discussed soak your thumb in a warm salt water soak 3 times daily for 10 minutes each.  Keep clean, dry and covered in between these treatments.  Take the pain medication if needed for pain, this will make you drowsy so do not drive within 4 hours of taking this medication.  Take your entire course of antibiotic prescribed.

## 2017-06-10 NOTE — ED Provider Notes (Signed)
[facility]ERGENCY DEPARTMENT Provider Note   CSN: [id number] Arrival date & time: 06/09/17  1740     History   Chief Complaint Chief Complaint  Patient presents with  . Finger Injury    HPI Richard Kerr is a 56 y.o. male who is right-handed, presenting with pain swelling and infection of his left distal thumb which has been slowly worsening for the past week.  He reports pulling a hangnail causing his current symptoms.  He has worsening pain with radiation into his proximal finger.  He denies fevers or chills.  Pain is constant but worsened with palpation and movement.  There has been no drainage from the infected site.  He has had no medications or treatment prior to arrival.  The history is provided by the patient.    Past Medical History:  Diagnosis Date  . Constipation   . GERD (gastroesophageal reflux disease)   . Hemorrhoids   . Hypercholesteremia   . Hypertension   . Poor historian     Patient Active Problem List   Diagnosis Date Noted  . Unspecified constipation 11/23/2012  . Essential hypertension, benign 11/23/2012  . Rectal mass 11/23/2012    Past Surgical History:  Procedure Laterality Date  . MINOR HEMORRHOIDECTOMY     Dr. Malvin JohnsBradford  . removal of cyst     to back       Home Medications    Prior to Admission medications   Medication Sig Start Date End Date Taking? Authorizing Provider  cephALEXin (KEFLEX) 500 MG capsule Take 1 capsule (500 mg total) 4 (four) times daily by mouth. 06/09/17   Jenan Ellegood, Raynelle FanningJulie, PA-C  diltiazem 2 % GEL Apply 1 application topically 2 (two) times daily. 12/02/12   Malissa Hippoehman, Najeeb U, MD  HYDROcodone-acetaminophen (NORCO/VICODIN) 5-325 MG tablet Take 1 tablet every 4 (four) hours as needed by mouth. 06/09/17   Tasia Liz, Raynelle FanningJulie, PA-C  HYDROcodone-acetaminophen (NORCO/VICODIN) 5-325 MG tablet Take 1 tablet every 4 (four) hours as needed by mouth. 06/09/17   Doristine Shehan, Raynelle FanningJulie, PA-C  metoprolol tartrate (LOPRESSOR) 25 MG tablet Take 25  mg by mouth daily.    [provider]  nitroGLYCERIN (NITROSTAT) 0.3 MG SL tablet Place 1 tablet (0.3 mg total) under the tongue every 5 (five) minutes as needed for chest pain. 12/02/12   Rehman, Joline MaxcyNajeeb U, MD  simvastatin (ZOCOR) 20 MG tablet Take 20 mg by mouth every evening.    [provider]    Family History Family History  Problem Relation Age of Onset  . Heart murmur Mother   . Brain cancer Father     Social History Social History   Tobacco Use  . Smoking status: Never Smoker  Substance Use Topics  . Alcohol use: No  . Drug use: No     Allergies   Patient has no known allergies.   Review of Systems Review of Systems  Constitutional: Negative for chills and fever.  Respiratory: Negative for shortness of breath and wheezing.   Skin: Positive for color change and wound.  Neurological: Negative for numbness.     Physical Exam Updated Vital Signs BP (!) 153/119 (BP Location: Right Arm)   Pulse (!) 102   Temp 98.5 F (36.9 C) (Oral)   Resp 17   Ht 6' (1.829 m)   Wt 99.8 kg (220 lb)   SpO2 98%   BMI 29.84 kg/m   Physical Exam  Constitutional: He appears well-developed and well-nourished. No distress.  HENT:  Head: Normocephalic.  Neck: Neck supple.  Cardiovascular: Normal rate.  Pulmonary/Chest: Effort normal. He has no wheezes.  Musculoskeletal: Normal range of motion. He exhibits no edema.  Skin: There is erythema.  Paronychia noted of left thumb, ulnar edge of the nail.  Moderate edema and induration present.  There is no red streaking.  Distal sensation is intact.  Tenderness to distal thumb tuft, but no induration or erythema.     ED Treatments / Results  Labs (all labs ordered are listed, but only abnormal results are displayed) Labs Reviewed - No data to display  EKG  EKG Interpretation None       Radiology No results found.  Procedures Procedures (including critical care time)  INCISION AND DRAINAGE Performed  by: Burgess AmorIDOL, Trea Latner Consent: Verbal consent obtained. Risks and benefits: risks, benefits and alternatives were discussed Type: abscess  Body area: left thumb  Anesthesia: digital block #11 scalpel was used to raise the cuticle edge from the nail plate.  Local anesthetic: lidocaine 2 % without epinephrine  Anesthetic total: 3 ml  Complexity: complex Site was flushed copiously with normal saline.  Drainage: purulent  Drainage amount: Moderate  Packing material: No packing  Patient tolerance: Patient tolerated the procedure well with no immediate complications.     Medications Ordered in ED Medications - No data to display   Initial Impression / Assessment and Plan / ED Course  I have reviewed the triage vital signs and the nursing notes.  Pertinent labs & imaging results that were available during my care of the patient were reviewed by me and considered in my medical decision making (see chart for details).     Discussed home treatment including warm water soaks, gentle massage.  Keep site clean dry and covered when not doing this treatment.  He was placed on Keflex and also prescribed hydrocodone.  Finger was wrapped in a bulky dressing.  Advised to follow-up by his PCP or return here for any new or worsening symptoms.  Final Clinical Impressions(s) / ED Diagnoses   Final diagnoses:  Paronychia of thumb, left    ED Discharge Orders        Ordered    cephALEXin (KEFLEX) 500 MG capsule  4 times daily     06/09/17 2135    HYDROcodone-acetaminophen (NORCO/VICODIN) 5-325 MG tablet  Every 4 hours PRN     06/09/17 2135    HYDROcodone-acetaminophen (NORCO/VICODIN) 5-325 MG tablet  Every 4 hours PRN     06/09/17 2135       Burgess Amordol, Ezechiel [place], PA-C 06/10/17 [id number]    Bethann BerkshireZammit, Joseph, MD 06/10/17 [phone]

## 2017-06-16 MED FILL — Hydrocodone-Acetaminophen Tab 5-325 MG: ORAL | Qty: 6 | Status: AC

## 2017-07-28 ENCOUNTER — Observation Stay (HOSPITAL_COMMUNITY)
Admission: EM | Admit: 2017-07-28 | Discharge: 2017-07-28 | Disposition: A | Discharge status: Home / Self Care | Payer: BLUE CROSS/BLUE SHIELD | Attending: Internal Medicine | Admitting: Internal Medicine

## 2017-07-28 ENCOUNTER — Emergency Department (HOSPITAL_COMMUNITY): Payer: BLUE CROSS/BLUE SHIELD

## 2017-07-28 ENCOUNTER — Encounter (HOSPITAL_COMMUNITY): Payer: Self-pay

## 2017-07-28 ENCOUNTER — Other Ambulatory Visit: Payer: Self-pay

## 2017-07-28 DIAGNOSIS — H9319 Tinnitus, unspecified ear: Secondary | ICD-10-CM | POA: Insufficient documentation

## 2017-07-28 DIAGNOSIS — E7800 Pure hypercholesterolemia, unspecified: Secondary | ICD-10-CM | POA: Diagnosis not present

## 2017-07-28 DIAGNOSIS — H6123 Impacted cerumen, bilateral: Secondary | ICD-10-CM | POA: Insufficient documentation

## 2017-07-28 DIAGNOSIS — Z79899 Other long term (current) drug therapy: Secondary | ICD-10-CM | POA: Insufficient documentation

## 2017-07-28 DIAGNOSIS — K5900 Constipation, unspecified: Secondary | ICD-10-CM | POA: Insufficient documentation

## 2017-07-28 DIAGNOSIS — I10 Essential (primary) hypertension: Secondary | ICD-10-CM | POA: Diagnosis not present

## 2017-07-28 DIAGNOSIS — R232 Flushing: Secondary | ICD-10-CM | POA: Insufficient documentation

## 2017-07-28 DIAGNOSIS — T39091A Poisoning by salicylates, accidental (unintentional), initial encounter: Secondary | ICD-10-CM | POA: Diagnosis not present

## 2017-07-28 DIAGNOSIS — Z809 Family history of malignant neoplasm, unspecified: Secondary | ICD-10-CM | POA: Insufficient documentation

## 2017-07-28 DIAGNOSIS — T391X1A Poisoning by 4-Aminophenol derivatives, accidental (unintentional), initial encounter: Secondary | ICD-10-CM | POA: Diagnosis not present

## 2017-07-28 DIAGNOSIS — K219 Gastro-esophageal reflux disease without esophagitis: Secondary | ICD-10-CM | POA: Diagnosis present

## 2017-07-28 DIAGNOSIS — Z8249 Family history of ischemic heart disease and other diseases of the circulatory system: Secondary | ICD-10-CM | POA: Diagnosis not present

## 2017-07-28 DIAGNOSIS — Z7982 Long term (current) use of aspirin: Secondary | ICD-10-CM | POA: Insufficient documentation

## 2017-07-28 LAB — COMPREHENSIVE METABOLIC PANEL WITH GFR
ALT: 31 U/L (ref 17–63)
AST: 26 U/L (ref 15–41)
Albumin: 4.3 g/dL (ref 3.5–5.0)
Alkaline Phosphatase: 109 U/L (ref 38–126)
Anion gap: 13 (ref 5–15)
BUN: 15 mg/dL (ref 6–20)
CO2: 22 mmol/L (ref 22–32)
Calcium: 9.1 mg/dL (ref 8.9–10.3)
Chloride: 108 mmol/L (ref 101–111)
Creatinine, Ser: 1.12 mg/dL (ref 0.61–1.24)
GFR calc Af Amer: >60 mL/min
GFR calc non Af Amer: >60 mL/min
Glucose, Bld: 102 mg/dL — ABNORMAL HIGH (ref 65–99)
Potassium: 4 mmol/L (ref 3.5–5.1)
Sodium: 143 mmol/L (ref 135–145)
Total Bilirubin: 0.5 mg/dL (ref 0.3–1.2)
Total Protein: 7.6 g/dL (ref 6.5–8.1)

## 2017-07-28 LAB — ACETAMINOPHEN LEVEL
Acetaminophen (Tylenol), Serum: 16 ug/mL (ref 10–30)
Acetaminophen (Tylenol), Serum: <10 ug/mL — ABNORMAL LOW (ref 10–30)

## 2017-07-28 LAB — MAGNESIUM: MAGNESIUM: 2.3 mg/dL (ref 1.7–2.4)

## 2017-07-28 LAB — BLOOD GAS, VENOUS
ACID-BASE DEFICIT: 0.6 mmol/L (ref 0.0–2.0)
ACID-BASE DEFICIT: 0.8 mmol/L (ref 0.0–2.0)
Bicarbonate: 23.8 mmol/L (ref 20.0–28.0)
Bicarbonate: 24.4 mmol/L (ref 20.0–28.0)
FIO2: 0.21
FIO2: 0.21
O2 SAT: 85.2 %
O2 SAT: 99.2 %
PCO2 VEN: 37.2 mmHg — AB (ref 44.0–60.0)
PCO2 VEN: 32.7 mmHg — AB (ref 44.0–60.0)
PO2 VEN: 49.5 mmHg — AB (ref 32.0–45.0)
PO2 VEN: 159 mmHg — AB (ref 32.0–45.0)
Patient temperature: 37
Patient temperature: 37
pH, Ven: 7.415 (ref 7.250–7.430)
pH, Ven: 7.454 — ABNORMAL HIGH (ref 7.250–7.430)

## 2017-07-28 LAB — I-STAT CG4 LACTIC ACID, ED: Lactic Acid, Venous: 0.73 mmol/L (ref 0.5–1.9)

## 2017-07-28 LAB — URINALYSIS, ROUTINE W REFLEX MICROSCOPIC
BILIRUBIN URINE: NEGATIVE
Glucose, UA: NEGATIVE mg/dL
Hgb urine dipstick: NEGATIVE
KETONES UR: NEGATIVE mg/dL
Leukocytes, UA: NEGATIVE
Nitrite: NEGATIVE
PH: 6 (ref 5.0–8.0)
Protein, ur: NEGATIVE mg/dL
Specific Gravity, Urine: 1.014 (ref 1.005–1.030)

## 2017-07-28 LAB — BASIC METABOLIC PANEL
Anion gap: 12 (ref 5–15)
Anion gap: 12 (ref 5–15)
BUN: 13 mg/dL (ref 6–20)
BUN: 12 mg/dL (ref 6–20)
CHLORIDE: 105 mmol/L (ref 101–111)
CHLORIDE: 103 mmol/L (ref 101–111)
CO2: 22 mmol/L (ref 22–32)
CO2: 23 mmol/L (ref 22–32)
CREATININE: 0.99 mg/dL (ref 0.61–1.24)
CREATININE: 1.02 mg/dL (ref 0.61–1.24)
Calcium: 8 mg/dL — ABNORMAL LOW (ref 8.9–10.3)
Calcium: 7.9 mg/dL — ABNORMAL LOW (ref 8.9–10.3)
GFR calc Af Amer: >60 mL/min (ref >60)
GFR calc Af Amer: >60 mL/min (ref >60)
GFR calc non Af Amer: >60 mL/min (ref >60)
GFR calc non Af Amer: >60 mL/min (ref >60)
Glucose, Bld: 103 mg/dL — ABNORMAL HIGH (ref 65–99)
Glucose, Bld: 106 mg/dL — ABNORMAL HIGH (ref 65–99)
Potassium: 3.7 mmol/L (ref 3.5–5.1)
Potassium: 3.3 mmol/L — ABNORMAL LOW (ref 3.5–5.1)
SODIUM: 139 mmol/L (ref 135–145)
SODIUM: 138 mmol/L (ref 135–145)

## 2017-07-28 LAB — BLOOD GAS, ARTERIAL
Acid-base deficit: 1.7 mmol/L (ref 0.0–2.0)
Bicarbonate: 23 mmol/L (ref 20.0–28.0)
FIO2: 21
O2 Saturation: 71.6 %
pCO2 arterial: 32.8 mmHg (ref 32.0–48.0)
pH, Arterial: 7.439 (ref 7.350–7.450)
pO2, Arterial: 36.4 mmHg — CL (ref 83.0–108.0)

## 2017-07-28 LAB — CBC WITH DIFFERENTIAL/PLATELET
BASOS PCT: 0 %
Basophils Absolute: 0 10*3/uL (ref 0.0–0.1)
EOS ABS: 0.1 10*3/uL (ref 0.0–0.7)
Eosinophils Relative: 1 %
HEMATOCRIT: 46.6 % (ref 39.0–52.0)
Hemoglobin: 15.9 g/dL (ref 13.0–17.0)
Lymphocytes Relative: 22 %
Lymphs Abs: 2.3 10*3/uL (ref 0.7–4.0)
MCH: 29.9 pg (ref 26.0–34.0)
MCHC: 34.1 g/dL (ref 30.0–36.0)
MCV: 87.8 fL (ref 78.0–100.0)
MONO ABS: 0.4 10*3/uL (ref 0.1–1.0)
MONOS PCT: 4 %
Neutro Abs: 7.4 10*3/uL (ref 1.7–7.7)
Neutrophils Relative %: 73 %
Platelets: 195 10*3/uL (ref 150–400)
RBC: 5.31 MIL/uL (ref 4.22–5.81)
RDW: 13.4 % (ref 11.5–15.5)
WBC: 10.1 10*3/uL (ref 4.0–10.5)

## 2017-07-28 LAB — PHOSPHORUS: PHOSPHORUS: 2.9 mg/dL (ref 2.5–4.6)

## 2017-07-28 LAB — SALICYLATE LEVEL
Salicylate Lvl: 39.9 mg/dL (ref 2.8–30.0)
Salicylate Lvl: 28.4 mg/dL (ref 2.8–30.0)
Salicylate Lvl: 23 mg/dL (ref 2.8–30.0)

## 2017-07-28 LAB — HEPATIC FUNCTION PANEL
ALK PHOS: 87 U/L (ref 38–126)
ALT: 25 U/L (ref 17–63)
AST: 22 U/L (ref 15–41)
Albumin: 3.8 g/dL (ref 3.5–5.0)
BILIRUBIN DIRECT: 0.1 mg/dL (ref 0.1–0.5)
BILIRUBIN INDIRECT: 0.5 mg/dL (ref 0.3–0.9)
BILIRUBIN TOTAL: 0.6 mg/dL (ref 0.3–1.2)
Total Protein: 6.4 g/dL — ABNORMAL LOW (ref 6.5–8.1)

## 2017-07-28 LAB — RAPID URINE DRUG SCREEN, HOSP PERFORMED
Amphetamines: NOT DETECTED
Barbiturates: NOT DETECTED
Benzodiazepines: NOT DETECTED
Cocaine: NOT DETECTED
Opiates: NOT DETECTED
Tetrahydrocannabinol: NOT DETECTED

## 2017-07-28 LAB — ETHANOL: Alcohol, Ethyl (B): <10 mg/dL (ref <10)

## 2017-07-28 LAB — MRSA PCR SCREENING: MRSA by PCR: NEGATIVE

## 2017-07-28 MED ORDER — SODIUM CHLORIDE 0.9 % IV BOLUS (SEPSIS)
1000.0000 mL | Freq: Once | INTRAVENOUS | Status: AC
  Administered 2017-07-28: 1000 mL via INTRAVENOUS

## 2017-07-28 MED ORDER — METOPROLOL TARTRATE 25 MG PO TABS
25.0000 mg | ORAL_TABLET | Freq: Two times a day (BID) | ORAL | Status: DC
  Administered 2017-07-28: 25 mg via ORAL
  Filled 2017-07-28: qty 1

## 2017-07-28 MED ORDER — METOPROLOL TARTRATE 25 MG PO TABS: 25.0000 mg | ORAL_TABLET | Freq: Two times a day (BID) | ORAL | 0 refills | Status: AC

## 2017-07-28 MED ORDER — METOPROLOL TARTRATE 5 MG/5ML IV SOLN
2.5000 mg | Freq: Four times a day (QID) | INTRAVENOUS | Status: DC
  Administered 2017-07-28: 2.5 mg via INTRAVENOUS
  Filled 2017-07-28 (×2): qty 5

## 2017-07-28 MED ORDER — STERILE WATER FOR INJECTION IV SOLN
INTRAVENOUS | Status: DC
  Administered 2017-07-28 (×2): via INTRAVENOUS
  Filled 2017-07-28 (×10): qty 850

## 2017-07-28 MED ORDER — ONDANSETRON HCL 4 MG PO TABS: 4.0000 mg | ORAL_TABLET | Freq: Four times a day (QID) | ORAL | Status: DC | PRN

## 2017-07-28 MED ORDER — CARBAMIDE PEROXIDE 6.5 % OT SOLN
5.0000 [drp] | Freq: Once | OTIC | Status: AC
  Administered 2017-07-28: 5 [drp] via OTIC
  Filled 2017-07-28: qty 15

## 2017-07-28 MED ORDER — FAMOTIDINE IN NACL 20-0.9 MG/50ML-% IV SOLN
20.0000 mg | Freq: Two times a day (BID) | INTRAVENOUS | Status: DC
  Administered 2017-07-28: 20 mg via INTRAVENOUS
  Filled 2017-07-28: qty 50

## 2017-07-28 MED ORDER — DEXTROSE-NACL 5-0.9 % IV SOLN
INTRAVENOUS | Status: DC
Start: 2017-07-28 — End: 2017-07-28
  Administered 2017-07-28: 07:00:00 via INTRAVENOUS

## 2017-07-28 MED ORDER — ONDANSETRON HCL 4 MG/2ML IJ SOLN: 4.0000 mg | Freq: Four times a day (QID) | INTRAMUSCULAR | Status: DC | PRN

## 2017-07-28 MED ORDER — SODIUM BICARBONATE 8.4 % IV SOLN
INTRAVENOUS | Status: AC
  Filled 2017-07-28: qty 150

## 2017-07-28 NOTE — Progress Notes (Signed)
Patient is alert and oriented. Vital signs are stable. Saline lock removed. Discharge instructions given. Prescriptions given. Patient verbalized understanding of instructions.  Patient left floor via wheelchair accompanied by nursing staff.

## 2017-07-28 NOTE — H&P (Signed)
History and Physical    Richard HoseDaryl K Garoutte ZOX:[id number] DOB: 1960-12-30 DOA: 07/28/2017  PCP: [facility], [facility]   Patient coming from: Home.  I have personally briefly reviewed [facility]  Chief Complaint: Ear ringing and flushed feeling.  HPI: Richard Kerr is a 57 y.o. male with medical history significant of constipation, hemorrhoids, anal fissure, GERD, hypercholesterolemia, hypertension who is coming to the emergency department with complaints that he is having ear ringing and feeling flushed and warm after he took 16 or 18 OTC Goody powders for back pain.  He mentioned that he first took about 10 on the first time yesterday and took 6 to 8 today.  Besides elbow symptoms, also mentioned that he feels lightheaded and nauseous, but denies headache, sore throat, dyspnea, chest pain, palpitations, abdominal pain, emesis, diarrhea, melena, dysuria, frequency or hematuria.  He gets occasional hematochezia from hemorrhoids.  No suicidal or homicidal ideations.  ED Course: His initial vital signs temperature 97.73F, pulse 96, respirations 17, blood pressure 150/95 and O2 sat 100%.  His lab work shows a lactic acid of 0.73 millimol/L.  VBG has a pH of 7.439, all other values are unremarkable as well.  His CBC was normal.  CMP shows a glucose of 102 mg/dL, all other values are normal.  Alcohol was less than 10, magnesium 2.3 and phosphorus 2.9 mg/dL.  Acetaminophen was 16 mcg/mL.  His salicylate level was 39.9 mg/dL.  He received 2 L of normal saline bolus.  Dr. Manus Gunningancour contacted poison control who recommended sodium bicarb infusion until the salicylate level is below 35 mcg/dL.  He received Debrox on his ear canals for bilateral cerumen impaction  Review of Systems: As per HPI otherwise 10 point review of systems negative.    Past Medical History:  Diagnosis Date  . Constipation   . GERD (gastroesophageal reflux disease)   .  Hemorrhoids   . Hypercholesteremia   . Hypertension   . Poor historian     Past Surgical History:  Procedure Laterality Date  . COLONOSCOPY N/A 12/02/2012   Procedure: COLONOSCOPY;  Surgeon: Malissa HippoNajeeb U Rehman, MD;  Location: [facility] ENDO SUITE;  Service: Endoscopy;  Laterality: N/A;  210-moved to 12:55 Ann to notify pt  . MINOR HEMORRHOIDECTOMY     Dr. Malvin JohnsBradford  . PENILE BIOPSY N/A 02/21/2013   Procedure: PENILE BIOPSY;  Surgeon: Marcine MatarStephen Dahlstedt, MD;  Location: [facility] ORS;  Service: Urology;  Laterality: N/A;  . removal of cyst     to back     reports that  has never smoked. he has never used smokeless tobacco. He reports that he does not drink alcohol or use drugs.  No Known Allergies  Family History  Problem Relation Age of Onset  . Heart murmur Mother   . Brain cancer Father     Prior to Admission medications   Medication Sig Start Date End Date Taking? Authorizing Provider  cephALEXin (KEFLEX) 500 MG capsule Take 1 capsule (500 mg total) 4 (four) times daily by mouth. 06/09/17   Idol, Raynelle FanningJulie, PA-C  diltiazem 2 % GEL Apply 1 application topically 2 (two) times daily. 12/02/12   Malissa Hippoehman, Najeeb U, MD  HYDROcodone-acetaminophen (NORCO/VICODIN) 5-325 MG tablet Take 1 tablet every 4 (four) hours as needed by mouth. 06/09/17   Idol, Raynelle FanningJulie, PA-C  HYDROcodone-acetaminophen (NORCO/VICODIN) 5-325 MG tablet Take 1 tablet every 4 (four) hours as needed by mouth. 06/09/17   Burgess AmorIdol, Julie,  PA-C  metoprolol tartrate (LOPRESSOR) 25 MG tablet Take 25 mg by mouth daily.    [provider]  nitroGLYCERIN (NITROSTAT) 0.3 MG SL tablet Place 1 tablet (0.3 mg total) under the tongue every 5 (five) minutes as needed for chest pain. 12/02/12   Rehman, Joline Maxcy, MD  simvastatin (ZOCOR) 20 MG tablet Take 20 mg by mouth every evening.    [provider]    Physical Exam: Vitals:   07/28/17 0147 07/28/17 0250 07/28/17 0300 07/28/17 0330  BP: (!) 156/91 129/77 129/83 138/71  Pulse: 92 98 96 96  Resp:  19 14 16 18   Temp:      TempSrc:      SpO2: 97% 100% 100% 100%  Weight:      Height:        Constitutional: NAD, calm, comfortable Eyes: PERRL, lids and conjunctivae normal ENMT: Bilateral cerumen impaction.  Mucous membranes are moist. Posterior pharynx clear of any exudate or lesions. Neck: normal, supple, no masses, no thyromegaly Respiratory: clear to auscultation bilaterally, no wheezing, no crackles. Normal respiratory effort. No accessory muscle use.  Cardiovascular: Mildly tachycardic at 102 bpm, no murmurs / rubs / gallops. No extremity edema. 2+ pedal pulses. No carotid bruits.  Abdomen: Obese, soft, no tenderness, no masses palpated. No hepatosplenomegaly. Bowel sounds positive.  Musculoskeletal: no clubbing / cyanosis.  Positive tenderness on palpation of  paraspinal muscles of lower lumbar area.  Good ROM, no contractures. Normal muscle tone.  Skin: no rashes, lesions, ulcers on limited skin exam Neurologic: CN 2-12 grossly intact. Sensation intact, DTR normal. Strength 5/5 in all 4.  Psychiatric: Normal judgment and insight. Alert and oriented x 3. Normal mood.     Labs on Admission: I have personally reviewed following labs and imaging studies  CBC: Recent Labs  Lab 07/28/17 0154  WBC 10.1  NEUTROABS 7.4  HGB 15.9  HCT 46.6  MCV 87.8  PLT 195   Basic Metabolic Panel: Recent Labs  Lab 07/28/17 0154  NA 143  K 4.0  CL 108  CO2 22  GLUCOSE 102*  BUN 15  CREATININE 1.12  CALCIUM 9.1  MG 2.3  PHOS 2.9   GFR: Estimated Creatinine Clearance: 91.2 mL/min (by C-G formula based on SCr of 1.12 mg/dL). Liver Function Tests: Recent Labs  Lab 07/28/17 0154  AST 26  ALT 31  ALKPHOS 109  BILITOT 0.5  PROT 7.6  ALBUMIN 4.3   No results for input(s): LIPASE, AMYLASE in the last 168 hours. No results for input(s): AMMONIA in the last 168 hours. Coagulation Profile: No results for input(s): INR, PROTIME in the last 168 hours. Cardiac Enzymes: No results  for input(s): CKTOTAL, CKMB, CKMBINDEX, TROPONINI in the last 168 hours. BNP (last 3 results) No results for input(s): PROBNP in the last 8760 hours. HbA1C: No results for input(s): HGBA1C in the last 72 hours. CBG: No results for input(s): GLUCAP in the last 168 hours. Lipid Profile: No results for input(s): CHOL, HDL, LDLCALC, TRIG, CHOLHDL, LDLDIRECT in the last 72 hours. Thyroid Function Tests: No results for input(s): TSH, T4TOTAL, FREET4, T3FREE, THYROIDAB in the last 72 hours. Anemia Panel: No results for input(s): VITAMINB12, FOLATE, FERRITIN, TIBC, IRON, RETICCTPCT in the last 72 hours. Urine analysis: No results found for: COLORURINE, APPEARANCEUR, LABSPEC, PHURINE, GLUCOSEU, HGBUR, BILIRUBINUR, KETONESUR, PROTEINUR, UROBILINOGEN, NITRITE, LEUKOCYTESUR  Radiological Exams on Admission: Dg Chest 2 View  Result Date: 07/28/2017 CLINICAL DATA:  Initial evaluation for acute aspirin overdose. EXAM: CHEST  2  VIEW COMPARISON:  None. FINDINGS: Mild cardiomegaly.  Mediastinal silhouette normal. Lungs normally inflated. No focal infiltrates. No pulmonary edema or pleural effusion. No pneumothorax. No acute osseous abnormality. IMPRESSION: 1. No active cardiopulmonary disease. 2. Mild cardiomegaly. Electronically Signed   By: Rise Mu M.D.   On: 07/28/2017 02:06    EKG: Independently reviewed. Vent. rate 90 BPM PR interval * ms QRS duration 97 ms QT/QTc 366/448 ms P-R-T axes 29 -24 36 Sinus rhythm Borderline left axis deviation RSR' in V1 or V2, probably normal variant No previous tracing to compare to.  Assessment/Plan Principal Problem:   Salicylate poisoning I do not believe that this was intentional. Observation/stepdown. Continue sodium bicarb infusion. In anticipation of possible hypokalemia from bicarbonate infusion and to prevent hypoglycemia I will start D5 NS with KCl 20 mEq at 125 mL/hr. Follow-up salicylate level every 4 hours. Follow venous blood gas  every 4 hours. Follow BMP every 4 hours  Active Problems:   Essential hypertension, benign Metoprolol 2.5 mg IVP every 6 hours. Monitor blood pressure and heart rate. Resume oral metoprolol once cleared for diet.    Hypercholesteremia Hold simvastatin for now. Resume it once the patient is cleared for diet.    GERD (gastroesophageal reflux disease) Famotidine 20 mg IVP every 12 hours.   DVT prophylaxis: SCDs. Code Status: Full code. Family Communication:  Disposition Plan: Admit to stepdown for treatment and close monitoring. Consults called: Poison control was called by Dr. Manus Gunning. Admission status: Observation/stepdown.   Bobette Mo MD [facility] Pager [phone].  If 7PM-7AM, please contact night-coverage [email or web address] Password [facility]  07/28/2017, 4:29 AM

## 2017-07-28 NOTE — ED Provider Notes (Signed)
[facility] EMERGENCY DEPARTMENT Provider Note   CSN: [id number] Arrival date & time: 07/28/17  0051     History   Chief Complaint Chief Complaint  Patient presents with  . Back Pain    HPI Richard Kerr is a 57 y.o. male.  Patient developed low back pain after lifting boxes yesterday.  He was trying to treat this with over-the-counter Doan's (magnesium salicylate) and Goody powders today.  He states he thinks he is having "allergic reaction" because his ears are stopped up and he is feeling lightheaded and dizzy.  Patient estimates he took 6-8 Doan's tablets yesterday and about 8 Goody powders today.  His back pain is in his low back and does not radiate down his buttocks or legs.  No bowel or bladder incontinence.  No focal weakness, numbness or tingling.  No fever or vomiting.  Does have some nausea and lightheadedness and "wooziness".   The history is provided by the patient.  Back Pain   Associated symptoms include weakness. Pertinent negatives include no chest pain, no fever, no abdominal pain and no dysuria.    Past Medical History:  Diagnosis Date  . Constipation   . GERD (gastroesophageal reflux disease)   . Hemorrhoids   . Hypercholesteremia   . Hypertension   . Poor historian     Patient Active Problem List   Diagnosis Date Noted  . Unspecified constipation 11/23/2012  . Essential hypertension, benign 11/23/2012  . Rectal mass 11/23/2012    Past Surgical History:  Procedure Laterality Date  . COLONOSCOPY N/A 12/02/2012   Procedure: COLONOSCOPY;  Surgeon: Malissa Hippo, MD;  Location: [facility] ENDO SUITE;  Service: Endoscopy;  Laterality: N/A;  210-moved to 12:55 Ann to notify pt  . MINOR HEMORRHOIDECTOMY     Dr. Malvin Johns  . PENILE BIOPSY N/A 02/21/2013   Procedure: PENILE BIOPSY;  Surgeon: Marcine Matar, MD;  Location: [facility] ORS;  Service: Urology;  Laterality: N/A;  . removal of cyst     to back       Home Medications    Prior to Admission  medications   Medication Sig Start Date End Date Taking? Authorizing Provider  cephALEXin (KEFLEX) 500 MG capsule Take 1 capsule (500 mg total) 4 (four) times daily by mouth. 06/09/17   Idol, Raynelle Fanning, PA-C  diltiazem 2 % GEL Apply 1 application topically 2 (two) times daily. 12/02/12   Malissa Hippo, MD  HYDROcodone-acetaminophen (NORCO/VICODIN) 5-325 MG tablet Take 1 tablet every 4 (four) hours as needed by mouth. 06/09/17   Idol, Raynelle Fanning, PA-C  HYDROcodone-acetaminophen (NORCO/VICODIN) 5-325 MG tablet Take 1 tablet every 4 (four) hours as needed by mouth. 06/09/17   Idol, Raynelle Fanning, PA-C  metoprolol tartrate (LOPRESSOR) 25 MG tablet Take 25 mg by mouth daily.    [provider]  nitroGLYCERIN (NITROSTAT) 0.3 MG SL tablet Place 1 tablet (0.3 mg total) under the tongue every 5 (five) minutes as needed for chest pain. 12/02/12   Rehman, Joline Maxcy, MD  simvastatin (ZOCOR) 20 MG tablet Take 20 mg by mouth every evening.    [provider]    Family History Family History  Problem Relation Age of Onset  . Heart murmur Mother   . Brain cancer Father     Social History Social History   Tobacco Use  . Smoking status: Never Smoker  . Smokeless tobacco: Never Used  Substance Use Topics  . Alcohol use: No  . Drug use: No     Allergies  Patient has no known allergies.   Review of Systems Review of Systems  Constitutional: Negative for activity change, appetite change and fever.  HENT: Positive for ear pain and tinnitus.   Respiratory: Negative for cough, chest tightness and shortness of breath.   Cardiovascular: Negative for chest pain and leg swelling.  Gastrointestinal: Negative for abdominal pain, nausea and vomiting.  Genitourinary: Negative for dysuria and hematuria.  Musculoskeletal: Positive for back pain. Negative for arthralgias, myalgias and neck pain.  Skin: Negative for wound.  Neurological: Positive for dizziness, weakness and light-headedness.   all other  systems are negative except as noted in the HPI and PMH.    Physical Exam Updated Vital Signs BP (!) 158/95   Pulse 96   Temp 97.7 F (36.5 C) (Oral)   Resp 17   Ht 6' (1.829 m)   Wt 99.8 kg (220 lb)   SpO2 100%   BMI 29.84 kg/m   Physical Exam  Constitutional: He is oriented to person, place, and time. He appears well-developed and well-nourished. No distress.  Poor historian  HENT:  Head: Normocephalic and atraumatic.  Mouth/Throat: Oropharynx is clear and moist. No oropharyngeal exudate.  Cerumen impaction bilaterally  Eyes: Conjunctivae and EOM are normal. Pupils are equal, round, and reactive to light.  Neck: Normal range of motion. Neck supple.  No meningismus.  Cardiovascular: Normal rate, regular rhythm, normal heart sounds and intact distal pulses.  No murmur heard. Pulmonary/Chest: Effort normal and breath sounds normal. No respiratory distress.  Abdominal: Soft. There is no tenderness. There is no rebound and no guarding.  Musculoskeletal: Normal range of motion. He exhibits tenderness. He exhibits no edema.  Paraspinal lumbar tenderness 5/5 strength in bilateral lower extremities. Ankle plantar and dorsiflexion intact. Great toe extension intact bilaterally. +2 DP and PT pulses. +2 patellar reflexes bilaterally. Normal gait.,  Neurological: He is alert and oriented to person, place, and time. No cranial nerve deficit. He exhibits normal muscle tone. Coordination normal.  No ataxia on finger to nose bilaterally. No pronator drift. 5/5 strength throughout. CN 2-12 intact.Equal grip strength. Sensation intact.   Skin: Skin is warm.  Psychiatric: He has a normal mood and affect. His behavior is normal.  Nursing note and vitals reviewed.    ED Treatments / Results  Labs (all labs ordered are listed, but only abnormal results are displayed) Labs Reviewed  URINALYSIS, ROUTINE W REFLEX MICROSCOPIC - Abnormal; Notable for the following components:      Result Value    Color, Urine STRAW (*)    All other components within normal limits  COMPREHENSIVE METABOLIC PANEL - Abnormal; Notable for the following components:   Glucose, Bld 102 (*)    All other components within normal limits  SALICYLATE LEVEL - Abnormal; Notable for the following components:   Salicylate Lvl 39.9 (*)    All other components within normal limits  BLOOD GAS, ARTERIAL - Abnormal; Notable for the following components:   pO2, Arterial 36.4 (*)    All other components within normal limits  MRSA PCR SCREENING  CBC WITH DIFFERENTIAL/PLATELET  ACETAMINOPHEN LEVEL  ETHANOL  RAPID URINE DRUG SCREEN, HOSP PERFORMED  MAGNESIUM  PHOSPHORUS  HIV ANTIBODY (ROUTINE TESTING)  BASIC METABOLIC PANEL  BASIC METABOLIC PANEL  BASIC METABOLIC PANEL  BLOOD GAS, VENOUS  BLOOD GAS, VENOUS  BLOOD GAS, VENOUS  SALICYLATE LEVEL  SALICYLATE LEVEL  SALICYLATE LEVEL  I-STAT CG4 LACTIC ACID, ED    EKG  EKG Interpretation  Date/Time:  Wednesday July 28 2017 01:47:32 EST Ventricular Rate:  90 PR Interval:    QRS Duration: 97 QT Interval:  366 QTC Calculation: 448 R Axis:   -24 Text Interpretation:  Sinus rhythm Borderline left axis deviation RSR' in V1 or V2, probably normal variant No previous ECGs available Confirmed by Glynn Octaveancour, [place] Dal[place]o [phone]([id number]) on 07/28/2017 1:53:15 AM       Radiology Dg Chest 2 View  Result Date: 07/28/2017 CLINICAL DATA:  Initial evaluation for acute aspirin overdose. EXAM: CHEST  2 VIEW COMPARISON:  None. FINDINGS: Mild cardiomegaly.  Mediastinal silhouette normal. Lungs normally inflated. No focal infiltrates. No pulmonary edema or pleural effusion. No pneumothorax. No acute osseous abnormality. IMPRESSION: 1. No active cardiopulmonary disease. 2. Mild cardiomegaly. Electronically Signed   By: Rise MuBenjamin  McClintock M.D.   On: 07/28/2017 02:06    Procedures Procedures (including critical care time)  Medications Ordered in ED Medications - No data to  display   Initial Impression / Assessment and Plan / ED Course  I have reviewed the triage vital signs and the nursing notes.  Pertinent labs & imaging results that were available during my care of the patient were reviewed by me and considered in my medical decision making (see chart for details).    Patient with low back pain since yesterday.  No weakness, numbness or tingling.  No incontinence.  Low suspicion for cord compression or cauda equina.  Concern for salicylate toxicity given patient's "ear fullness" and dizziness and lightheadedness.  Does have cerumen impaction on exam as well.  Will obtain labs, EKG, cardiac monitor, ABG and lactate  PH normal.  Bicarb 22  Discussed with Patty at poison control.  Patient will be started on bicarb drip at 250 cc/h until salicylate level is less than 35.  Will also need recheck of acetaminophen level.  Creatinine is normal potassium is normal.  Does not appear to need emergency dialysis at this time.  His normal mental status and normal vital signs.  Poison control recommendations reviewed.  Patient will be given IV sodium bicarbonate to maintain urine output of 2 cc/kg/h.  Admission to stepdown unit d/w Dr. Robb Matarrtiz.  CRITICAL CARE Performed by: Glynn OctaveANCOUR, Gwendlyn Hanback Total critical care time: 45 minutes Critical care time was exclusive of separately billable procedures and treating other patients. Critical care was necessary to treat or prevent imminent or life-threatening deterioration. Critical care was time spent personally by me on the following activities: development of treatment plan with patient and/or surrogate as well as nursing, discussions with consultants, evaluation of patient's response to treatment, examination of patient, obtaining history from patient or surrogate, ordering and performing treatments and interventions, ordering and review of laboratory studies, ordering and review of radiographic studies, pulse oximetry and  re-evaluation of patient's condition.  Final Clinical Impressions(s) / ED Diagnoses   Final diagnoses:  Salicylate overdose, accidental or unintentional, initial encounter    ED Discharge Orders    None       Diontae Route, Jeannett SeniorStephen, MD 07/28/17 [phone]

## 2017-07-28 NOTE — ED Notes (Signed)
Date and time results received: 07/28/17 3:06 AM Test: Salicylate  Critical Value: 39.9  Name of Provider Notified: Dr Manus Gunningancour  Orders Received? Or Actions Taken?: Dr Manus Gunningancour notified- poison control called

## 2017-07-28 NOTE — Discharge Summary (Addendum)
Physician Discharge Summary  Tomasa HoseDaryl K Sweetland AVW:[id number] DOB: [birth year] DOA: 07/28/2017  PCP: Nathen [facility]Pllc, [facility]  Admit date: 07/28/2017 Discharge date: 07/28/2017  Admitted From: Home Disposition: Home  Recommendations for Outpatient Follow-up:  1. Follow up with PCP in 1-2 weeks 2. Please obtain BMP/CBC in one week  Discharge Condition: Stable CODE STATUS: Full Diet recommendation: Heart Healthy  Brief/Interim Summary: 57 year old male presents to the emergency room with tinnitus and feeling flushed.  He had recently taken increased amounts of Goody powders for back pain.  Found to have elevated salicylate level as well as Tylenol level.  This was felt to be unintentional salicylate poisoning.  He denied any suicidal or homicidal ideations. Patient was admitted to the hospital and started on IV hydration.  He was started on a bicarbonate infusion.  Salicylate level as well as Tylenol level had trended down.  LFTs remain normal.  EKG was followed and did not show any acute changes.  Patient is feeling better.  He is requesting discharge home.  He has been cleared for discharge by poison control.  He has been advised to not take any further Goody powders or other medications containing aspirin.  Follow-up with his primary care physician for chronic pain management needs.  He was also noted to have elevated blood pressure and tachycardia.  He has been started on oral Lopressor.  Blood pressures are now stable.  Follow-up with primary care physician.  Discharge Diagnoses:  Principal Problem:   Salicylate poisoning Active Problems:   Essential hypertension, benign   Hypercholesteremia   GERD (gastroesophageal reflux disease)    Discharge Instructions  Discharge Instructions    Diet - low sodium heart healthy   Complete by:  As directed    Increase activity slowly   Complete by:  As directed      Allergies as of 07/28/2017   No Known Allergies     Medication List     STOP taking these medications   ASPERCREME EX   GOODYS EXTRA STRENGTH 500-325-65 MG Pack Generic drug:  Aspirin-Acetaminophen-Caffeine     TAKE these medications   BIOFREEZE EX Apply 1 application topically daily as needed (pain).   metoprolol tartrate 25 MG tablet Commonly known as:  LOPRESSOR Take 1 tablet (25 mg total) by mouth 2 (two) times daily.       No Known Allergies  Consultations:     Procedures/Studies: Dg Chest 2 View  Result Date: 07/28/2017 CLINICAL DATA:  Initial evaluation for acute aspirin overdose. EXAM: CHEST  2 VIEW COMPARISON:  None. FINDINGS: Mild cardiomegaly.  Mediastinal silhouette normal. Lungs normally inflated. No focal infiltrates. No pulmonary edema or pleural effusion. No pneumothorax. No acute osseous abnormality. IMPRESSION: 1. No active cardiopulmonary disease. 2. Mild cardiomegaly. Electronically Signed   By: Rise MuBenjamin  McClintock M.D.   On: 07/28/2017 02:06       Subjective: Feeling better today.  No new complaints.  Wants to go home.  Discharge Exam: Vitals:   07/28/17 1200 07/28/17 1247  BP: (!) 141/90   Pulse: 84   Resp: 17   Temp:  98.3 F (36.8 C)  SpO2: 92%    Vitals:   07/28/17 0928 07/28/17 1000 07/28/17 1200 07/28/17 1247  BP:  (!) 158/90 (!) 141/90   Pulse:  (!) 105 84   Resp:  19 17   Temp: 98.3 F (36.8 C)   98.3 F (36.8 C)  TempSrc: Oral   Oral  SpO2:  96% 92%   Weight:  Height:        General: Pt is alert, awake, not in acute distress Cardiovascular: RRR, S1/S2 +, no rubs, no gallops Respiratory: CTA bilaterally, no wheezing, no rhonchi Abdominal: Soft, NT, ND, bowel sounds + Extremities: no edema, no cyanosis    The results of significant diagnostics from this hospitalization (including imaging, microbiology, ancillary and laboratory) are listed below for reference.     Microbiology: No results found for this or any previous visit (from the past 240 hour(s)).   Labs: BNP (last 3  results) No results for input(s): BNP in the last 8760 hours. Basic Metabolic Panel: Recent Labs  Lab 07/28/17 0154 07/28/17 0716 07/28/17 1056  NA 143 139 138  K 4.0 3.7 3.3*  CL 108 105 103  CO2 22 22 23   GLUCOSE 102* 103* 106*  BUN 15 13 12   CREATININE 1.12 0.99 1.02  CALCIUM 9.1 8.0* 7.9*  MG 2.3  --   --   PHOS 2.9  --   --    Liver Function Tests: Recent Labs  Lab 07/28/17 0154 07/28/17 1056  AST 26 22  ALT 31 25  ALKPHOS 109 87  BILITOT 0.5 0.6  PROT 7.6 6.4*  ALBUMIN 4.3 3.8   No results for input(s): LIPASE, AMYLASE in the last 168 hours. No results for input(s): AMMONIA in the last 168 hours. CBC: Recent Labs  Lab 07/28/17 0154  WBC 10.1  NEUTROABS 7.4  HGB 15.9  HCT 46.6  MCV 87.8  PLT 195   Cardiac Enzymes: No results for input(s): CKTOTAL, CKMB, CKMBINDEX, TROPONINI in the last 168 hours. BNP: Invalid input(s): POCBNP CBG: No results for input(s): GLUCAP in the last 168 hours. D-Dimer No results for input(s): DDIMER in the last 72 hours. Hgb A1c No results for input(s): HGBA1C in the last 72 hours. Lipid Profile No results for input(s): CHOL, HDL, LDLCALC, TRIG, CHOLHDL, LDLDIRECT in the last 72 hours. Thyroid function studies No results for input(s): TSH, T4TOTAL, T3FREE, THYROIDAB in the last 72 hours.  Invalid input(s): FREET3 Anemia work up No results for input(s): VITAMINB12, FOLATE, FERRITIN, TIBC, IRON, RETICCTPCT in the last 72 hours. Urinalysis    Component Value Date/Time   COLORURINE STRAW (A) 07/28/2017 0520   APPEARANCEUR CLEAR 07/28/2017 0520   LABSPEC 1.014 07/28/2017 0520   PHURINE 6.0 07/28/2017 0520   GLUCOSEU NEGATIVE 07/28/2017 0520   HGBUR NEGATIVE 07/28/2017 0520   BILIRUBINUR NEGATIVE 07/28/2017 0520   KETONESUR NEGATIVE 07/28/2017 0520   PROTEINUR NEGATIVE 07/28/2017 0520   NITRITE NEGATIVE 07/28/2017 0520   LEUKOCYTESUR NEGATIVE 07/28/2017 0520   Sepsis Labs Invalid input(s): PROCALCITONIN,  WBC,   LACTICIDVEN Microbiology No results found for this or any previous visit (from the past 240 hour(s)).   Time coordinating discharge: Over 30 minutes  SIGNED:   Erick Blinks, MD  [facility] 07/28/2017, 12:55 PM Pager   If 7PM-7AM, please contact night-coverage [email or web address] Password [email or web address]

## 2017-07-28 NOTE — ED Notes (Signed)
Verified with pharmacy that dextrose5%-0.9% sodium chloride is compatible with Sodium bicarbonate infusion.

## 2017-07-28 NOTE — ED Notes (Addendum)
Okay to hold off on dextrose5%-0.9% sodium chloride infusion until admitted to 2nd floor per Dr Robb Matarrtiz.

## 2017-07-28 NOTE — Progress Notes (Signed)
**Note De-identified Richard Kerr Obfuscation** EKG complete RN notified  and placed in patient chart.  

## 2017-07-28 NOTE — ED Notes (Signed)
Patient transported to X-ray 

## 2017-07-28 NOTE — ED Triage Notes (Signed)
Pt reports having some back pain and taking otc meds for same.  Pt states he is worried about possibly having a reaction to the medications he was taking because he feels like his "ears are stopped up and can't hear well now"  Pt denies itching or hives.

## 2017-07-29 LAB — HIV ANTIBODY (ROUTINE TESTING W REFLEX): HIV Screen 4th Generation wRfx: NONREACTIVE

## 2017-08-04 ENCOUNTER — Emergency Department (HOSPITAL_COMMUNITY)
Admission: EM | Admit: 2017-08-04 | Discharge: 2017-08-04 | Disposition: A | Discharge status: Home / Self Care | Payer: BLUE CROSS/BLUE SHIELD | Attending: Emergency Medicine | Admitting: Emergency Medicine

## 2017-08-04 ENCOUNTER — Encounter (HOSPITAL_COMMUNITY): Payer: Self-pay | Admitting: Emergency Medicine

## 2017-08-04 ENCOUNTER — Other Ambulatory Visit: Payer: Self-pay

## 2017-08-04 ENCOUNTER — Emergency Department (HOSPITAL_COMMUNITY): Payer: BLUE CROSS/BLUE SHIELD

## 2017-08-04 DIAGNOSIS — Z79899 Other long term (current) drug therapy: Secondary | ICD-10-CM | POA: Diagnosis not present

## 2017-08-04 DIAGNOSIS — M545 Low back pain, unspecified: Secondary | ICD-10-CM

## 2017-08-04 DIAGNOSIS — I10 Essential (primary) hypertension: Secondary | ICD-10-CM | POA: Diagnosis not present

## 2017-08-04 DIAGNOSIS — R319 Hematuria, unspecified: Secondary | ICD-10-CM | POA: Insufficient documentation

## 2017-08-04 LAB — URINALYSIS, ROUTINE W REFLEX MICROSCOPIC
BILIRUBIN URINE: NEGATIVE
Bacteria, UA: NONE SEEN
GLUCOSE, UA: NEGATIVE mg/dL
Ketones, ur: 5 mg/dL — AB
LEUKOCYTES UA: NEGATIVE
NITRITE: NEGATIVE
PH: 6 (ref 5.0–8.0)
Protein, ur: 100 mg/dL — AB
SPECIFIC GRAVITY, URINE: 1.02 (ref 1.005–1.030)
Squamous Epithelial / LPF: NONE SEEN

## 2017-08-04 LAB — CBC WITH DIFFERENTIAL/PLATELET
BASOS ABS: 0 10*3/uL (ref 0.0–0.1)
Basophils Relative: 0 %
EOS ABS: 0 10*3/uL (ref 0.0–0.7)
Eosinophils Relative: 0 %
HEMATOCRIT: 48.9 % (ref 39.0–52.0)
Hemoglobin: 17 g/dL (ref 13.0–17.0)
Lymphocytes Relative: 20 %
Lymphs Abs: 2 10*3/uL (ref 0.7–4.0)
MCH: 30.2 pg (ref 26.0–34.0)
MCHC: 34.8 g/dL (ref 30.0–36.0)
MCV: 86.9 fL (ref 78.0–100.0)
MONO ABS: 0.8 10*3/uL (ref 0.1–1.0)
MONOS PCT: 8 %
NEUTROS ABS: 7.2 10*3/uL (ref 1.7–7.7)
NEUTROS PCT: 72 %
Platelets: 222 10*3/uL (ref 150–400)
RBC: 5.63 MIL/uL (ref 4.22–5.81)
RDW: 13.2 % (ref 11.5–15.5)
WBC: 10 10*3/uL (ref 4.0–10.5)

## 2017-08-04 LAB — BASIC METABOLIC PANEL
ANION GAP: 13 (ref 5–15)
BUN: 14 mg/dL (ref 6–20)
CALCIUM: 9.7 mg/dL (ref 8.9–10.3)
CO2: 25 mmol/L (ref 22–32)
CREATININE: 0.99 mg/dL (ref 0.61–1.24)
Chloride: 100 mmol/L — ABNORMAL LOW (ref 101–111)
GFR calc Af Amer: >60 mL/min (ref >60)
Glucose, Bld: 101 mg/dL — ABNORMAL HIGH (ref 65–99)
Potassium: 3.7 mmol/L (ref 3.5–5.1)
SODIUM: 138 mmol/L (ref 135–145)

## 2017-08-04 MED ORDER — HYDROCODONE-ACETAMINOPHEN 5-325 MG PO TABS: 1.0000 | ORAL_TABLET | Freq: Four times a day (QID) | ORAL | 0 refills | Status: DC | PRN

## 2017-08-04 MED ORDER — PREDNISONE 10 MG PO TABS: 40.0000 mg | ORAL_TABLET | Freq: Every day | ORAL | 0 refills | Status: DC

## 2017-08-04 NOTE — ED Triage Notes (Addendum)
Pt reports lower back pain since 07/26/17. Pt reports was seen at urgent care for same and was given muscle relaxer with no relief. Pt reports "always have blood in my urine" and reports has scheduled ultrasound for 08/05/16 to "rule out kidney stones".

## 2017-08-04 NOTE — ED Provider Notes (Addendum)
[facility] EMERGENCY DEPARTMENT Provider Note   CSN: [id number] Arrival date & time: 08/04/17  0705     History   Chief Complaint Chief Complaint  Patient presents with  . Back Pain    HPI Richard Kerr is a 57 y.o. male.  Patient with complaint of lower back pain predominantly on the left side left flank since December 31.  Patient seen in urgent care treated with a muscle relaxer and nonsteroidal.  Patient's been told by having blood in his urine.  Urgent care was planning to do an ultrasound on January 10 to rule out kidney stones.  Patient occasionally has pain in bilateral groin area.  No numbness or weakness in his feet or legs.  No radiation of pain down the back of the legs.  Patient was admitted on January 2 for accidental salicylate overdose due to BP powders.  And was discharged on the fourth.  Patient has been seen by urgent care since that time.  Patient states that the pain is getting difficult to bear.      Past Medical History:  Diagnosis Date  . Constipation   . GERD (gastroesophageal reflux disease)   . Hemorrhoids   . Hypercholesteremia   . Hypertension   . Poor historian     Patient Active Problem List   Diagnosis Date Noted  . Salicylate poisoning 07/28/2017  . Hypercholesteremia 07/28/2017  . GERD (gastroesophageal reflux disease) 07/28/2017  . Unspecified constipation 11/23/2012  . Essential hypertension, benign 11/23/2012  . Rectal mass 11/23/2012    Past Surgical History:  Procedure Laterality Date  . COLONOSCOPY N/A 12/02/2012   Procedure: COLONOSCOPY;  Surgeon: Malissa Hippo, MD;  Location: [facility];  Service: Endoscopy;  Laterality: N/A;  210-moved to 12:55 Ann to notify pt  . MINOR HEMORRHOIDECTOMY     Dr. Malvin Johns  . PENILE BIOPSY N/A 02/21/2013   Procedure: PENILE BIOPSY;  Surgeon: Marcine Matar, MD;  Location: [facility] ORS;  Service: Urology;  Laterality: N/A;  . removal of cyst     to back       Home Medications     Prior to Admission medications   Medication Sig Start Date End Date Taking? Authorizing Provider  HYDROcodone-acetaminophen (NORCO/VICODIN) 5-325 MG tablet Take 1-2 tablets by mouth every 6 (six) hours as needed. 08/04/17   Vanetta Mulders, MD  Menthol, Topical Analgesic, (BIOFREEZE EX) Apply 1 application topically daily as needed (pain).    [provider]  metoprolol tartrate (LOPRESSOR) 25 MG tablet Take 1 tablet (25 mg total) by mouth 2 (two) times daily. 07/28/17   Erick Blinks, MD  predniSONE (DELTASONE) 10 MG tablet Take 4 tablets (40 mg total) by mouth daily. 08/04/17   Vanetta Mulders, MD    Family History Family History  Problem Relation Age of Onset  . Heart murmur Mother   . Brain cancer Father     Social History Social History   Tobacco Use  . Smoking status: Never Smoker  . Smokeless tobacco: Never Used  Substance Use Topics  . Alcohol use: No  . Drug use: No     Allergies   Patient has no known allergies.   Review of Systems Review of Systems  Constitutional: Negative for fever.  HENT: Negative for congestion.   Eyes: Negative for redness.  Respiratory: Negative for shortness of breath.   Cardiovascular: Negative for chest pain.  Gastrointestinal: Negative for abdominal pain.  Genitourinary: Positive for hematuria. Negative for dysuria.  Musculoskeletal: Positive for  back pain.  Skin: Negative for rash.  Neurological: Negative for weakness and numbness.  Hematological: Does not bruise/bleed easily.  Psychiatric/Behavioral: Negative for confusion.     Physical Exam Updated Vital Signs BP (!) 131/105 (BP Location: Right Arm)   Pulse (!) 107   Temp 98 F (36.7 C) (Oral)   Resp 18   Ht 1.829 m (6')   Wt 100.2 kg (221 lb)   SpO2 98%   BMI 29.97 kg/m   Physical Exam  Constitutional: He is oriented to person, place, and time. He appears well-developed and well-nourished. No distress.  HENT:  Head: Normocephalic and atraumatic.   Mouth/Throat: Oropharynx is clear and moist.  Eyes: Conjunctivae and EOM are normal. Pupils are equal, round, and reactive to light.  Neck: Neck supple.  Cardiovascular: Normal rate, regular rhythm and normal heart sounds.  Pulmonary/Chest: Effort normal and breath sounds normal.  Abdominal: Soft. Bowel sounds are normal. There is no tenderness.  Musculoskeletal: Normal range of motion.  Neurological: He is alert and oriented to person, place, and time. No cranial nerve deficit or sensory deficit. He exhibits normal muscle tone. Coordination normal.  Skin: Skin is warm.  Nursing note and vitals reviewed.    ED Treatments / Results  Labs (all labs ordered are listed, but only abnormal results are displayed) Labs Reviewed  BASIC METABOLIC PANEL - Abnormal; Notable for the following components:      Result Value   Chloride 100 (*)    Glucose, Bld 101 (*)    All other components within normal limits  URINALYSIS, ROUTINE W REFLEX MICROSCOPIC - Abnormal; Notable for the following components:   Hgb urine dipstick SMALL (*)    Ketones, ur 5 (*)    Protein, ur 100 (*)    All other components within normal limits  CBC WITH DIFFERENTIAL/PLATELET    EKG  EKG Interpretation None       Radiology Ct Renal Stone Study  Result Date: 08/04/2017 CLINICAL DATA:  Right flank pain EXAM: CT ABDOMEN AND PELVIS WITHOUT CONTRAST TECHNIQUE: Multidetector CT imaging of the abdomen and pelvis was performed following the standard protocol without IV contrast. COMPARISON:  KUB 08/18/2013 FINDINGS: Lower chest: Mild bibasilar atelectasis. Hepatobiliary: Normal liver.  Gallbladder and bile ducts normal Pancreas: Negative Spleen: Negative Adrenals/Urinary Tract: Negative for urinary tract calculi. No renal obstruction or mass. Urinary bladder normal. Stomach/Bowel: Negative for bowel obstruction. Stomach and small bowel normal. No bowel wall edema or mass. Normal appendix. Vascular/Lymphatic: Negative  Reproductive: Enlarged prostate measuring 43 x 44 mm. Other: No free fluid. Negative for mass. Inguinal hernia containing fat bilaterally. Musculoskeletal: Mild lumbar spine degenerative changes. No acute skeletal abnormality. IMPRESSION: No acute abnormality and negative for urinary tract calculi Normal appendix Enlarged prostate Bilateral small inguinal hernia containing fat Electronically Signed   By: Marlan Palauharles  Clark M.D.   On: 08/04/2017 09:16    Procedures Procedures (including critical care time)  Medications Ordered in ED Medications - No data to display   Initial Impression / Assessment and Plan / ED Course  I have reviewed the triage vital signs and the nursing notes.  Pertinent labs & imaging results that were available during my care of the patient were reviewed by me and considered in my medical decision making (see chart for details).     Urinalysis without evidence of hematuria here CT renal study without signs of any kidney stones or abnormalities.  Feel the pain is probably musculoskeletal in nature.  Will change patient over  to prednisone and a short course of hydrocodone.  Patient already has a work note to be out of work until 15 January.  Will have patient stop his meloxicam.  Final Clinical Impressions(s) / ED Diagnoses   Final diagnoses:  Acute left-sided low back pain without sciatica    ED Discharge Orders        Ordered    predniSONE (DELTASONE) 10 MG tablet  Daily     08/04/17 1008    HYDROcodone-acetaminophen (NORCO/VICODIN) 5-325 MG tablet  Every 6 hours PRN     08/04/17 1008       Vanetta Mulders, MD 08/04/17 1432    Vanetta Mulders, MD 08/04/17 1437

## 2017-08-04 NOTE — Discharge Instructions (Signed)
Take the prednisone as directed.  Stop taking the meloxicam.  Continue with the muscle relaxer.  Continue with your blood pressure medicine.  Take the hydrocodone as needed for additional pain relief.  Follow-up if not improving over the next several days.

## 2017-09-20 ENCOUNTER — Emergency Department (HOSPITAL_COMMUNITY)
Admission: EM | Admit: 2017-09-20 | Discharge: 2017-09-20 | Disposition: A | Discharge status: Home / Self Care | Payer: BLUE CROSS/BLUE SHIELD | Attending: Emergency Medicine | Admitting: Emergency Medicine

## 2017-09-20 ENCOUNTER — Encounter (HOSPITAL_COMMUNITY): Payer: Self-pay

## 2017-09-20 ENCOUNTER — Emergency Department (HOSPITAL_COMMUNITY): Payer: BLUE CROSS/BLUE SHIELD

## 2017-09-20 ENCOUNTER — Other Ambulatory Visit: Payer: Self-pay

## 2017-09-20 DIAGNOSIS — R0981 Nasal congestion: Secondary | ICD-10-CM | POA: Diagnosis present

## 2017-09-20 DIAGNOSIS — I10 Essential (primary) hypertension: Secondary | ICD-10-CM | POA: Insufficient documentation

## 2017-09-20 DIAGNOSIS — Z79899 Other long term (current) drug therapy: Secondary | ICD-10-CM | POA: Diagnosis not present

## 2017-09-20 DIAGNOSIS — J0110 Acute frontal sinusitis, unspecified: Secondary | ICD-10-CM | POA: Insufficient documentation

## 2017-09-20 MED ORDER — AMOXICILLIN 500 MG PO CAPS: 1000.0000 mg | ORAL_CAPSULE | Freq: Three times a day (TID) | ORAL | 0 refills | Status: DC

## 2017-09-20 MED ORDER — BENZONATATE 100 MG PO CAPS: 100.0000 mg | ORAL_CAPSULE | Freq: Three times a day (TID) | ORAL | 0 refills | Status: DC

## 2017-09-20 MED ORDER — MOMETASONE FUROATE 50 MCG/ACT NA SUSP: 2.0000 | Freq: Every day | NASAL | 0 refills | Status: DC

## 2017-09-20 NOTE — ED Provider Notes (Signed)
[facility] EMERGENCY DEPARTMENT Provider Note   CSN: [id number] Arrival date & time: 09/20/17  0422     History   Chief Complaint Chief Complaint  Patient presents with  . Nasal Congestion    cough    HPI Richard Kerr is a 57 y.o. male.  Patient reports a one-week history of cough, nasal congestion, headache and body ache.  States he "has a bug" and cannot get over it.  Reports severe congestion but cough is nonproductive.  Has had gradual onset headache and body aches.  Abdominal pain and headache from coughing.  Denies any sore throat.  Denies any documented fevers.  Denies any sick contacts.  No history Of asthma or COPD.  No recent travel.  Has been using Tylenol, ibuprofen, NyQuil and sinus medication at home with minimal relief.  He does not smoke.  He did receive a flu shot. No vomiting or diarrhea.   The history is provided by the patient.    Past Medical History:  Diagnosis Date  . Constipation   . GERD (gastroesophageal reflux disease)   . Hemorrhoids   . Hypercholesteremia   . Hypertension   . Poor historian     Patient Active Problem List   Diagnosis Date Noted  . Salicylate poisoning 07/28/2017  . Hypercholesteremia 07/28/2017  . GERD (gastroesophageal reflux disease) 07/28/2017  . Unspecified constipation 11/23/2012  . Essential hypertension, benign 11/23/2012  . Rectal mass 11/23/2012    Past Surgical History:  Procedure Laterality Date  . COLONOSCOPY N/A 12/02/2012   Procedure: COLONOSCOPY;  Surgeon: Malissa HippoNajeeb U Rehman, MD;  Location: [facility];  Service: Endoscopy;  Laterality: N/A;  210-moved to 12:55 Ann to notify pt  . MINOR HEMORRHOIDECTOMY     Dr. Malvin JohnsBradford  . PENILE BIOPSY N/A 02/21/2013   Procedure: PENILE BIOPSY;  Surgeon: Marcine MatarStephen Dahlstedt, MD;  Location: [facility] ORS;  Service: Urology;  Laterality: N/A;  . removal of cyst     to back       Home Medications    Prior to Admission medications   Medication Sig Start Date End Date  Taking? Authorizing Provider  HYDROcodone-acetaminophen (NORCO/VICODIN) 5-325 MG tablet Take 1-2 tablets by mouth every 6 (six) hours as needed. 08/04/17   Vanetta MuldersZackowski, Scott, MD  Menthol, Topical Analgesic, (BIOFREEZE EX) Apply 1 application topically daily as needed (pain).    [provider]  metoprolol tartrate (LOPRESSOR) 25 MG tablet Take 1 tablet (25 mg total) by mouth 2 (two) times daily. 07/28/17   Erick BlinksMemon, Jehanzeb, MD  predniSONE (DELTASONE) 10 MG tablet Take 4 tablets (40 mg total) by mouth daily. 08/04/17   Vanetta MuldersZackowski, Scott, MD    Family History Family History  Problem Relation Age of Onset  . Heart murmur Mother   . Brain cancer Father     Social History Social History   Tobacco Use  . Smoking status: Never Smoker  . Smokeless tobacco: Never Used  Substance Use Topics  . Alcohol use: No  . Drug use: No     Allergies   Patient has no known allergies.   Review of Systems Review of Systems  Constitutional: Positive for activity change, appetite change, chills and fatigue. Negative for fever.  HENT: Positive for congestion and rhinorrhea. Negative for ear discharge and sore throat.   Respiratory: Positive for cough. Negative for chest tightness and shortness of breath.   Cardiovascular: Negative for chest pain.  Gastrointestinal: Negative for abdominal pain, nausea and vomiting.  Genitourinary: Negative for dysuria  and hematuria.  Musculoskeletal: Positive for arthralgias, back pain and myalgias.  Neurological: Positive for headaches. Negative for dizziness and weakness.    all other systems are negative except as noted in the HPI and PMH.    Physical Exam Updated Vital Signs BP (!) 169/105 Comment: pt reports he has not taken BP medicine today   Pulse 100   Temp 97.6 F (36.4 C) (Oral)   Resp 18   Ht 6' (1.829 m)   Wt 98.4 kg (217 lb)   SpO2 100%   BMI 29.43 kg/m   Physical Exam  Constitutional: He is oriented to person, place, and time. He appears  well-developed and well-nourished. No distress.  HENT:  Head: Normocephalic and atraumatic.  Mouth/Throat: Oropharynx is clear and moist. No oropharyngeal exudate.  Congested No significant sinus tenderness  Mildly dry mucus membranes  Eyes: Conjunctivae and EOM are normal. Pupils are equal, round, and reactive to light.  Neck: Normal range of motion. Neck supple.  No meningismus.  Cardiovascular: Normal rate, regular rhythm, normal heart sounds and intact distal pulses.  No murmur heard. Pulmonary/Chest: Effort normal and breath sounds normal. No respiratory distress. He has no wheezes.  Abdominal: Soft. There is no tenderness. There is no rebound and no guarding.  Musculoskeletal: Normal range of motion. He exhibits no edema or tenderness.  Neurological: He is alert and oriented to person, place, and time. No cranial nerve deficit. He exhibits normal muscle tone. Coordination normal.  No ataxia on finger to nose bilaterally. No pronator drift. 5/5 strength throughout. CN 2-12 intact.Equal grip strength. Sensation intact.   Skin: Skin is warm.  Psychiatric: He has a normal mood and affect. His behavior is normal.  Nursing note and vitals reviewed.    ED Treatments / Results  Labs (all labs ordered are listed, but only abnormal results are displayed) Labs Reviewed - No data to display  EKG  EKG Interpretation None       Radiology Dg Chest 2 View  Result Date: 09/20/2017 CLINICAL DATA:  57 year old male with cough and congestion. EXAM: CHEST  2 VIEW COMPARISON:  Chest radiograph dated 07/28/2017 FINDINGS: There is no focal consolidation, pleural effusion, or pneumothorax. Stable cardiac silhouette. No acute osseous pathology. IMPRESSION: No active cardiopulmonary disease. Electronically Signed   By: Elgie Collard M.D.   On: 09/20/2017 05:24    Procedures Procedures (including critical care time)  Medications Ordered in ED Medications - No data to display   Initial  Impression / Assessment and Plan / ED Course  I have reviewed the triage vital signs and the nursing notes.  Pertinent labs & imaging results that were available during my care of the patient were reviewed by me and considered in my medical decision making (see chart for details).    1 week of headache, body aches, nasal congestion and cough.  No distress.  No hypoxia.  Lungs are clear without wheezing.  Chest x-ray negative for pneumonia.  Patient is out of Tamiflu window. Will treat for sinusitis given his severe congestion with headache and cough.  Patient instructed on antipyretics, antitussives, antibiotics for ongoing sinusitis.  Needs to establish care with PCP.  Return precautions discussed.  Final Clinical Impressions(s) / ED Diagnoses   Final diagnoses:  Acute frontal sinusitis, recurrence not specified    ED Discharge Orders    None       [place] Searles, Jeannett Senior, MD 09/20/17 [phone]

## 2017-09-20 NOTE — ED Notes (Signed)
Pt given water 

## 2017-09-20 NOTE — ED Notes (Signed)
Pt tolerated water with no problems.  

## 2017-09-20 NOTE — ED Triage Notes (Addendum)
Pt reports cough, nasal congestion, headache x one week. Pt has been treating with OTC medication (Tylenol cold, and Nyquil)  but symptoms persist. Pt denies fever. Afebrile in triage.

## 2017-12-09 ENCOUNTER — Ambulatory Visit: Payer: BLUE CROSS/BLUE SHIELD | Admitting: General Surgery

## 2018-03-10 ENCOUNTER — Ambulatory Visit: Payer: BLUE CROSS/BLUE SHIELD | Admitting: General Surgery

## 2018-03-10 ENCOUNTER — Encounter: Payer: Self-pay | Admitting: General Surgery

## 2018-03-10 VITALS — BP 135/89 | HR 67 | Temp 98.6°F | Resp 18 | Wt 200.0 lb

## 2018-03-10 DIAGNOSIS — K649 Unspecified hemorrhoids: Secondary | ICD-10-CM

## 2018-03-10 NOTE — H&P (View-Only) (Signed)
Richard Kerr; [id number]; [birth year]   HPI Patient is a 57 year old white male who was referred to my care by Terie PurserSamantha Jackson for evaluation treatment of hemorrhoidal disease.  Patient is status post a hemorrhoidectomy in the remote past.  He states that for many months, he has had irritation over the skin in his anus with occasional blood in his stools and when he wipes himself.  He is status post a colonoscopy in 2015 which was unremarkable.  Patient denies any diarrhea.  He has bowel movements daily.  He has tried multiple different medications for his history of constipation.  He has tried various creams which have not been helpful.  He currently has no rectal pain. Past Medical History:  Diagnosis Date  . Constipation   . GERD (gastroesophageal reflux disease)   . Hemorrhoids   . Hypercholesteremia   . Hypertension   . Poor historian     Past Surgical History:  Procedure Laterality Date  . COLONOSCOPY N/A 12/02/2012   Procedure: COLONOSCOPY;  Surgeon: Malissa HippoNajeeb U Rehman, MD;  Location: [facility];  Service: Endoscopy;  Laterality: N/A;  210-moved to 12:55 Ann to notify pt  . MINOR HEMORRHOIDECTOMY     Dr. Malvin JohnsBradford  . PENILE BIOPSY N/A 02/21/2013   Procedure: PENILE BIOPSY;  Surgeon: Marcine MatarStephen Dahlstedt, MD;  Location: [facility] OR[facility];  Service: Urology;  Laterality: N/A;  . removal of cyst     to back    Family History  Problem Relation Age of Onset  . Heart murmur Mother   . Brain cancer Father     Current Outpatient Medications on File Prior to Visit  Medication Sig Dispense Refill  . HYDROcodone-acetaminophen (NORCO/VICODIN) 5-325 MG tablet Take 1-2 tablets by mouth every 6 (six) hours as needed. 10 tablet 0  . metoprolol tartrate (LOPRESSOR) 25 MG tablet Take 1 tablet (25 mg total) by mouth 2 (two) times daily. 60 tablet 0   No current facility-administered medications on file prior to visit.     No Known Allergies  Social History   Substance and Sexual Activity  Alcohol  Use No    Social History   Tobacco Use  Smoking Status Never Smoker  Smokeless Tobacco Never Used    Review of Systems  Constitutional: Negative.   HENT: Negative.   Eyes: Negative.   Respiratory: Negative.   Cardiovascular: Negative.   Gastrointestinal: Negative.   Genitourinary: Negative.   Musculoskeletal: Negative.   Skin: Negative.   Neurological: Negative.   Endo/Heme/Allergies: Negative.   Psychiatric/Behavioral: Negative.     Objective   Vitals:   03/10/18 1111  BP: 135/89  Pulse: 67  Resp: 18  Temp: 98.6 F (37 C)    Physical Exam  Constitutional: He is oriented to person, place, and time. He appears well-developed and well-nourished. No distress.  HENT:  Head: Normocephalic and atraumatic.  Cardiovascular: Normal rate, regular rhythm and normal heart sounds. Exam reveals no gallop and no friction rub.  No murmur heard. Pulmonary/Chest: Effort normal and breath sounds normal. No stridor. No respiratory distress. He has no wheezes. He has no rales.  Genitourinary:  Genitourinary Comments: Excess external hemorrhoidal skin with an internal hemorrhoid noted along the right lateral aspect of the anus.  Possible left internal hemorrhoid palpated.  No active bleeding noted.  Normal sphincter tone.  No perianal skin irritation noted.  Neurological: He is alert and oriented to person, place, and time.  Skin: Skin is warm and dry.  Vitals reviewed. Primary care notes reviewed  Assessment  Bleeding hemorrhoidal disease Plan   Patient is scheduled for extensive hemorrhoidectomy on 03/21/2018.  The risks and benefits of the procedure including bleeding, infection, and recurrence of the hemorrhoidal disease were fully explained to the patient, who gave informed consent. 

## 2018-03-10 NOTE — Patient Instructions (Signed)
Hemorrhoids Hemorrhoids are swollen veins in and around the rectum or anus. There are two types of hemorrhoids:  Internal hemorrhoids. These occur in the veins that are just inside the rectum. They may poke through to the outside and become irritated and painful.  External hemorrhoids. These occur in the veins that are outside of the anus and can be felt as a painful swelling or hard lump near the anus.  Most hemorrhoids do not cause serious problems, and they can be managed with home treatments such as diet and lifestyle changes. If home treatments do not help your symptoms, procedures can be done to shrink or remove the hemorrhoids. What are the causes? This condition is caused by increased pressure in the anal area. This pressure may result from various things, including:  Constipation.  Straining to have a bowel movement.  Diarrhea.  Pregnancy.  Obesity.  Sitting for long periods of time.  Heavy lifting or other activity that causes you to strain.  Anal sex.  What are the signs or symptoms? Symptoms of this condition include:  Pain.  Anal itching or irritation.  Rectal bleeding.  Leakage of stool (feces).  Anal swelling.  One or more lumps around the anus.  How is this diagnosed? This condition can often be diagnosed through a visual exam. Other exams or tests may also be done, such as:  Examination of the rectal area with a gloved hand (digital rectal exam).  Examination of the anal canal using a small tube (anoscope).  A blood test, if you have lost a significant amount of blood.  A test to look inside the colon (sigmoidoscopy or colonoscopy).  How is this treated? This condition can usually be treated at home. However, various procedures may be done if dietary changes, lifestyle changes, and other home treatments do not help your symptoms. These procedures can help make the hemorrhoids smaller or remove them completely. Some of these procedures involve  surgery, and others do not. Common procedures include:  Rubber band ligation. Rubber bands are placed at the base of the hemorrhoids to cut off the blood supply to them.  Sclerotherapy. Medicine is injected into the hemorrhoids to shrink them.  Infrared coagulation. A type of light energy is used to get rid of the hemorrhoids.  Hemorrhoidectomy surgery. The hemorrhoids are surgically removed, and the veins that supply them are tied off.  Stapled hemorrhoidopexy surgery. A circular stapling device is used to remove the hemorrhoids and use staples to cut off the blood supply to them.  Follow these instructions at home: Eating and drinking  Eat foods that have a lot of fiber in them, such as whole grains, beans, nuts, fruits, and vegetables. Ask your health care provider about taking products that have added fiber (fiber supplements).  Drink enough fluid to keep your urine clear or pale yellow. Managing pain and swelling  Take warm sitz baths for 20 minutes, 3-4 times a day to ease pain and discomfort.  If directed, apply ice to the affected area. Using ice packs between sitz baths may be helpful. ? Put ice in a plastic bag. ? Place a towel between your skin and the bag. ? Leave the ice on for 20 minutes, 2-3 times a day. General instructions  Take over-the-counter and prescription medicines only as told by your health care provider.  Use medicated creams or suppositories as told.  Exercise regularly.  Go to the bathroom when you have the urge to have a bowel movement. Do not wait.    Avoid straining to have bowel movements.  Keep the anal area dry and clean. Use wet toilet paper or moist towelettes after a bowel movement.  Do not sit on the toilet for long periods of time. This increases blood pooling and pain. Contact a health care provider if:  You have increasing pain and swelling that are not controlled by treatment or medicine.  You have uncontrolled bleeding.  You  have difficulty having a bowel movement, or you are unable to have a bowel movement.  You have pain or inflammation outside the area of the hemorrhoids. This information is not intended to replace advice given to you by your health care provider. Make sure you discuss any questions you have with your health care provider. Document Released: 07/10/2000 Document Revised: 12/11/2015 Document Reviewed: 03/27/2015 Elsevier Interactive Patient Education  2018 [employer]  

## 2018-03-10 NOTE — Progress Notes (Signed)
Richard HoseDaryl K Kerr; [id number]; [birth year]   HPI Patient is a 57 year old white male who was referred to my care by Terie PurserSamantha Jackson for evaluation treatment of hemorrhoidal disease.  Patient is status post a hemorrhoidectomy in the remote past.  He states that for many months, he has had irritation over the skin in his anus with occasional blood in his stools and when he wipes himself.  He is status post a colonoscopy in 2015 which was unremarkable.  Patient denies any diarrhea.  He has bowel movements daily.  He has tried multiple different medications for his history of constipation.  He has tried various creams which have not been helpful.  He currently has no rectal pain. Past Medical History:  Diagnosis Date  . Constipation   . GERD (gastroesophageal reflux disease)   . Hemorrhoids   . Hypercholesteremia   . Hypertension   . Poor historian     Past Surgical History:  Procedure Laterality Date  . COLONOSCOPY N/A 12/02/2012   Procedure: COLONOSCOPY;  Surgeon: Malissa HippoNajeeb U Rehman, MD;  Location: [facility];  Service: Endoscopy;  Laterality: N/A;  210-moved to 12:55 Ann to notify pt  . MINOR HEMORRHOIDECTOMY     Dr. Malvin JohnsBradford  . PENILE BIOPSY N/A 02/21/2013   Procedure: PENILE BIOPSY;  Surgeon: Marcine MatarStephen Dahlstedt, MD;  Location: [facility];  Service: Urology;  Laterality: N/A;  . removal of cyst     to back    Family History  Problem Relation Age of Onset  . Heart murmur Mother   . Brain cancer Father     Current Outpatient Medications on File Prior to Visit  Medication Sig Dispense Refill  . HYDROcodone-acetaminophen (NORCO/VICODIN) 5-325 MG tablet Take 1-2 tablets by mouth every 6 (six) hours as needed. 10 tablet 0  . metoprolol tartrate (LOPRESSOR) 25 MG tablet Take 1 tablet (25 mg total) by mouth 2 (two) times daily. 60 tablet 0   No current facility-administered medications on file prior to visit.     No Known Allergies  Social History   Substance and Sexual Activity  Alcohol  Use No    Social History   Tobacco Use  Smoking Status Never Smoker  Smokeless Tobacco Never Used    Review of Systems  Constitutional: Negative.   HENT: Negative.   Eyes: Negative.   Respiratory: Negative.   Cardiovascular: Negative.   Gastrointestinal: Negative.   Genitourinary: Negative.   Musculoskeletal: Negative.   Skin: Negative.   Neurological: Negative.   Endo/Heme/Allergies: Negative.   Psychiatric/Behavioral: Negative.     Objective   Vitals:   03/10/18 1111  BP: 135/89  Pulse: 67  Resp: 18  Temp: 98.6 F (37 C)    Physical Exam  Constitutional: He is oriented to person, place, and time. He appears well-developed and well-nourished. No distress.  HENT:  Head: Normocephalic and atraumatic.  Cardiovascular: Normal rate, regular rhythm and normal heart sounds. Exam reveals no gallop and no friction rub.  No murmur heard. Pulmonary/Chest: Effort normal and breath sounds normal. No stridor. No respiratory distress. He has no wheezes. He has no rales.  Genitourinary:  Genitourinary Comments: Excess external hemorrhoidal skin with an internal hemorrhoid noted along the right lateral aspect of the anus.  Possible left internal hemorrhoid palpated.  No active bleeding noted.  Normal sphincter tone.  No perianal skin irritation noted.  Neurological: He is alert and oriented to person, place, and time.  Skin: Skin is warm and dry.  Vitals reviewed. Primary care notes reviewed  Assessment  Bleeding hemorrhoidal disease Plan   Patient is scheduled for extensive hemorrhoidectomy on 03/21/2018.  The risks and benefits of the procedure including bleeding, infection, and recurrence of the hemorrhoidal disease were fully explained to the patient, who gave informed consent.

## 2018-03-16 NOTE — Patient Instructions (Signed)
Richard HoseDaryl K Kerr  03/16/2018     @PREFPERIOPPHARMACY @   Your procedure is scheduled on  03/21/2018.  [facility]   [facility].M.  Call this number if you have problems the morning of surgery:  [phone]   Remember:  Do not eat or drink after midnight.  You may drink clear liquids until  12 midnight 03/20/2018 .  Clear liquids allowed are:                    Water, Juice (non-citric and without pulp), Carbonated beverages, Clear Tea, Black Coffee only, Plain Jell-O only, Gatorade and Plain Popsicles only    Take these medicines the morning of surgery with A SIP OF WATER  Hydrocodone, metoprolol.    Do not wear jewelry, make-up or nail polish.  Do not wear lotions, powders, or perfumes, or deodorant.  Do not shave 48 hours prior to surgery.  Men may shave face and neck.  Do not bring valuables to the hospital.  [facility] not [facility] any belongings or valuables.  Contacts, dentures or bridgework may not be worn into surgery.  Leave your suitcase in the car.  After surgery it may be brought to your room.  For patients admitted to the hospital, discharge time will be determined by your treatment team.  Patients discharged the day of surgery will not be allowed to drive home.   Name and phone number of your driver:   family Special instructions:  None  Please read over the following fact sheets that you were given. Anesthesia Post-op Instructions and Care and Recovery After Surgery       Hemorrhoids Hemorrhoids are swollen veins in and around the rectum or anus. Hemorrhoids can cause pain, itching, or bleeding. Most of the time, they do not cause serious problems. They usually get better with diet changes, lifestyle changes, and other home treatments. Follow these instructions at home: Eating and drinking  Eat foods that have fiber, such as whole grains, beans, nuts, fruits, and vegetables. Ask your doctor about taking products that have  added fiber (fibersupplements).  Drink enough fluid to keep your pee (urine) clear or pale yellow. For Pain and Swelling  Take a warm-water bath (sitz bath) for 20 minutes to ease pain. Do this 3-4 times a day.  If directed, put ice on the painful area. It may be helpful to use ice between your warm baths. ? Put ice in a plastic bag. ? Place a towel between your skin and the bag. ? Leave the ice on for 20 minutes, 2-3 times a day. General instructions  Take over-the-counter and prescription medicines only as told by your doctor. ? Medicated creams and medicines that are inserted into the anus (suppositories) may be used or applied as told.  Exercise often.  Go to the bathroom when you have the urge to poop (to have a bowel movement). Do not wait.  Avoid pushing too hard (straining) when you poop.  Keep the butt area dry and clean. Use wet toilet paper or moist paper towels.  Do not sit on the toilet for a long time. Contact a doctor if:  You have any of these: ? Pain and swelling that do not get better with treatment or medicine. ? Bleeding that will not stop. ? Trouble pooping or you cannot poop. ? Pain or swelling outside the area of the hemorrhoids. This information is  not intended to replace advice given to you by your health care provider. Make sure you discuss any questions you have with your health care provider. Document Released: 04/21/2008 Document Revised: 12/19/2015 Document Reviewed: 03/27/2015 Elsevier Interactive Patient Education  2018 [employer]. Surgical Procedures for Hemorrhoids Surgical procedures can be used to treat hemorrhoids. Hemorrhoids are swollen veins that are inside the rectum (internal hemorrhoids) or around the anus (external hemorrhoids). They are caused by increased pressure in the anal area. This pressure may result from straining to have a bowel movement (constipation), diarrhea, pregnancy, obesity, anal sex, or sitting for long periods of  time. Hemorrhoids can cause symptoms such as pain and bleeding. Surgery may be needed if diet changes, lifestyle changes, and other treatments do not help your symptoms. Various surgical methods may be used. Three common methods are:  Closed hemorrhoidectomy. The hemorrhoids are surgically removed, and the surgical cuts (incisions) are closed with stitches (sutures).  Open hemorrhoidectomy. The hemorrhoids are surgically removed, but the incisions are allowed to heal without sutures.  Stapled hemorrhoidopexy. The hemorrhoids are removed using a device that takes out a ring of excess tissue.  Tell a health care provider about:  Any allergies you have.  All medicines you are taking, including vitamins, herbs, eye drops, creams, and over-the-counter medicines.  Any problems you or family members have had with anesthetic medicines.  Any blood disorders you have.  Any surgeries you have had.  Any medical conditions you have.  Whether you are pregnant or may be pregnant. What are the risks? Generally, this is a safe procedure. However, problems may occur, including:  Infection.  Bleeding.  Allergic reactions to medicines.  Damage to other structures or organs.  Pain.  Constipation.  Difficulty passing urine.  Narrowing of the anal canal (stenosis).  Difficulty controlling bowel movements (incontinence).  What happens before the procedure?  Ask your health care provider about: ? Changing or stopping your regular medicines. This is especially important if you are taking diabetes medicines or blood thinners. ? Taking medicines such as aspirin and ibuprofen. These medicines can thin your blood. Do not take these medicines before your procedure if your health care provider instructs you not to.  You may need to have a procedure to examine the inside of your colon with a scope (colonoscopy). Your health care provider may do this to make sure that there are no other causes for  your bleeding or pain.  Follow instructions from your health care provider about eating or drinking restrictions.  You may be instructed to take a laxative and an enema to clean out your colon before surgery (bowel prep). Carefully follow instructions from your health care provider about bowel prep.  Ask your health care provider how your surgical site will be marked or identified.  You may be given antibiotic medicine to help prevent infection.  Plan to have someone take you home after the procedure. What happens during the procedure?  To reduce your risk of infection: ? Your health care team will wash or sanitize their hands. ? Your skin will be washed with soap.  An IV tube will be inserted into one of your veins.  You will be given one or more of the following: ? A medicine to help you relax (sedative). ? A medicine to numb the area (local anesthetic). ? A medicine to make you fall asleep (general anesthetic). ? A medicine that is injected into an area of your body to numb everything below the injection  site (regional anesthetic).  A lubricating jelly may be placed into your rectum.  Your surgeon will insert a short scope (anoscope) into your rectum to examine the hemorrhoids.  One of the following hemorrhoid procedures will be performed. Closed Hemorrhoidectomy  Your surgeon will use surgical instruments to open the tissue around the hemorrhoids.  The veins that supply the hemorrhoids will be tied off with a suture.  The hemorrhoids will be removed.  The tissue that surrounds the hemorrhoids will be closed with sutures that your body can absorb (absorbable sutures). Open Hemorrhoidectomy  The hemorrhoids will be removed with surgical instruments.  The incisions will be left open to heal without sutures. Stapled Hemorrhoidopexy  Your surgeon will use a circular stapling device to remove the hemorrhoids.  The device will be inserted into your anus. It will remove a  circular ring of tissue that includes hemorrhoid tissue and some tissue above the hemorrhoids.  The staples in the device will close the edges of removed tissue. This will cut off the blood supply to the hemorrhoids and will pull any remaining hemorrhoids back into place. Each of these procedures may vary among health care providers and hospitals. What happens after the procedure?  Your blood pressure, heart rate, breathing rate, and blood oxygen level will be monitored often until the medicines you were given have worn off.  You will be given pain medicine as needed. This information is not intended to replace advice given to you by your health care provider. Make sure you discuss any questions you have with your health care provider. Document Released: 05/10/2009 Document Revised: 12/19/2015 Document Reviewed: 10/08/2014 Elsevier Interactive Patient Education  2018 [employer]  General Anesthesia, Adult General anesthesia is the use of medicines to make a person "go to sleep" (be unconscious) for a medical procedure. General anesthesia is often recommended when a procedure:  Is long.  Requires you to be still or in an unusual position.  Is major and can cause you to lose blood.  Is impossible to do without general anesthesia.  The medicines used for general anesthesia are called general anesthetics. In addition to making you sleep, the medicines:  Prevent pain.  Control your blood pressure.  Relax your muscles.  Tell a health care provider about:  Any allergies you have.  All medicines you are taking, including vitamins, herbs, eye drops, creams, and over-the-counter medicines.  Any problems you or family members have had with anesthetic medicines.  Types of anesthetics you have had in the past.  Any bleeding disorders you have.  Any surgeries you have had.  Any medical conditions you have.  Any history of heart or lung conditions, such as heart failure, sleep  apnea, or chronic obstructive pulmonary disease (COPD).  Whether you are pregnant or may be pregnant.  Whether you use tobacco, alcohol, marijuana, or street drugs.  Any history of Financial plannermilitary service.  Any history of depression or anxiety. What are the risks? Generally, this is a safe procedure. However, problems may occur, including:  Allergic reaction to anesthetics.  Lung and heart problems.  Inhaling food or liquids from your stomach into your lungs (aspiration).  Injury to nerves.  Waking up during your procedure and being unable to move (rare).  Extreme agitation or a state of mental confusion (delirium) when you wake up from the anesthetic.  Air in the bloodstream, which can lead to stroke.  These problems are more likely to develop if you are having a major surgery or if  you have an advanced medical condition. You can prevent some of these complications by answering all of your health care provider's questions thoroughly and by following all pre-procedure instructions. General anesthesia can cause side effects, including:  Nausea or vomiting  A sore throat from the breathing tube.  Feeling cold or shivery.  Feeling tired, washed out, or achy.  Sleepiness or drowsiness.  Confusion or agitation.  What happens before the procedure? Staying hydrated Follow instructions from your health care provider about hydration, which may include:  Up to 2 hours before the procedure - you may continue to drink clear liquids, such as water, clear fruit juice, black coffee, and plain tea.  Eating and drinking restrictions Follow instructions from your health care provider about eating and drinking, which may include:  8 hours before the procedure - stop eating heavy meals or foods such as meat, fried foods, or fatty foods.  6 hours before the procedure - stop eating light meals or foods, such as toast or cereal.  6 hours before the procedure - stop drinking milk or drinks  that contain milk.  2 hours before the procedure - stop drinking clear liquids.  Medicines  Ask your health care provider about: ? Changing or stopping your regular medicines. This is especially important if you are taking diabetes medicines or blood thinners. ? Taking medicines such as aspirin and ibuprofen. These medicines can thin your blood. Do not take these medicines before your procedure if your health care provider instructs you not to. ? Taking new dietary supplements or medicines. Do not take these during the week before your procedure unless your health care provider approves them.  If you are told to take a medicine or to continue taking a medicine on the day of the procedure, take the medicine with sips of water. General instructions   Ask if you will be going home the same day, the following day, or after a longer hospital stay. ? Plan to have someone take you home. ? Plan to have someone stay with you for the first 24 hours after you leave the hospital or clinic.  For 3-6 weeks before the procedure, try not to use any tobacco products, such as cigarettes, chewing tobacco, and e-cigarettes.  You may brush your teeth on the morning of the procedure, but make sure to spit out the toothpaste. What happens during the procedure?  You will be given anesthetics through a mask and through an IV tube in one of your veins.  You may receive medicine to help you relax (sedative).  As soon as you are asleep, a breathing tube may be used to help you breathe.  An anesthesia specialist will stay with you throughout the procedure. He or she will help keep you comfortable and safe by continuing to give you medicines and adjusting the amount of medicine that you get. He or she will also watch your blood pressure, pulse, and oxygen levels to make sure that the anesthetics do not cause any problems.  If a breathing tube was used to help you breathe, it will be removed before you wake  up. The procedure may vary among health care providers and hospitals. What happens after the procedure?  You will wake up, often slowly, after the procedure is complete, usually in a recovery area.  Your blood pressure, heart rate, breathing rate, and blood oxygen level will be monitored until the medicines you were given have worn off.  You may be given medicine to help you  calm down if you feel anxious or agitated.  If you will be going home the same day, your health care provider may check to make sure you can stand, drink, and urinate.  Your health care providers will treat your pain and side effects before you go home.  Do not drive for 24 hours if you received a sedative.  You may: ? Feel nauseous and vomit. ? Have a sore throat. ? Have mental slowness. ? Feel cold or shivery. ? Feel sleepy. ? Feel tired. ? Feel sore or achy, even in parts of your body where you did not have surgery. This information is not intended to replace advice given to you by your health care provider. Make sure you discuss any questions you have with your health care provider. Document Released: 10/20/2007 Document Revised: 12/24/2015 Document Reviewed: 06/27/2015 Elsevier Interactive Patient Education  2018 [employer]. General Anesthesia, Adult, Care After These instructions provide you with information about caring for yourself after your procedure. Your health care provider may also give you more specific instructions. Your treatment has been planned according to current medical practices, but problems sometimes occur. Call your health care provider if you have any problems or questions after your procedure. What can I expect after the procedure? After the procedure, it is common to have:  Vomiting.  A sore throat.  Mental slowness.  It is common to feel:  Nauseous.  Cold or shivery.  Sleepy.  Tired.  Sore or achy, even in parts of your body where you did not have surgery.  Follow  these instructions at home: For at least 24 hours after the procedure:  Do not: ? Participate in activities where you could fall or become injured. ? Drive. ? Use heavy machinery. ? Drink alcohol. ? Take sleeping pills or medicines that cause drowsiness. ? Make important decisions or sign legal documents. ? Take care of children on your own.  Rest. Eating and drinking  If you vomit, drink water, juice, or soup when you can drink without vomiting.  Drink enough fluid to keep your urine clear or pale yellow.  Make sure you have little or no nausea before eating solid foods.  Follow the diet recommended by your health care provider. General instructions  Have a responsible adult stay with you until you are awake and alert.  Return to your normal activities as told by your health care provider. Ask your health care provider what activities are safe for you.  Take over-the-counter and prescription medicines only as told by your health care provider.  If you smoke, do not smoke without supervision.  Keep all follow-up visits as told by your health care provider. This is important. Contact a health care provider if:  You continue to have nausea or vomiting at home, and medicines are not helpful.  You cannot drink fluids or start eating again.  You cannot urinate after 8-12 hours.  You develop a skin rash.  You have fever.  You have increasing redness at the site of your procedure. Get help right away if:  You have difficulty breathing.  You have chest pain.  You have unexpected bleeding.  You feel that you are having a life-threatening or urgent problem. This information is not intended to replace advice given to you by your health care provider. Make sure you discuss any questions you have with your health care provider. Document Released: 10/19/2000 Document Revised: 12/16/2015 Document Reviewed: 06/27/2015 Elsevier Interactive Patient Education  [employer].

## 2018-03-17 ENCOUNTER — Encounter (HOSPITAL_COMMUNITY): Payer: Self-pay

## 2018-03-17 ENCOUNTER — Other Ambulatory Visit: Payer: Self-pay

## 2018-03-17 ENCOUNTER — Encounter (HOSPITAL_COMMUNITY)
Admission: RE | Admit: 2018-03-17 | Discharge: 2018-03-17 | Disposition: A | Discharge status: Home / Self Care | Payer: BLUE CROSS/BLUE SHIELD | Source: Ambulatory Visit | Attending: General Surgery | Admitting: General Surgery

## 2018-03-17 DIAGNOSIS — Z01812 Encounter for preprocedural laboratory examination: Secondary | ICD-10-CM | POA: Diagnosis present

## 2018-03-17 HISTORY — DX: Anxiety disorder, unspecified: F41.9

## 2018-03-17 LAB — BASIC METABOLIC PANEL
Anion gap: 8 (ref 5–15)
BUN: 21 mg/dL — ABNORMAL HIGH (ref 6–20)
CALCIUM: 8.9 mg/dL (ref 8.9–10.3)
CO2: 25 mmol/L (ref 22–32)
CREATININE: 1.26 mg/dL — AB (ref 0.61–1.24)
Chloride: 108 mmol/L (ref 98–111)
GFR calc Af Amer: >60 mL/min (ref >60)
GFR calc non Af Amer: >60 mL/min (ref >60)
Glucose, Bld: 88 mg/dL (ref 70–99)
Potassium: 4.4 mmol/L (ref 3.5–5.1)
Sodium: 141 mmol/L (ref 135–145)

## 2018-03-17 LAB — CBC WITH DIFFERENTIAL/PLATELET
Basophils Absolute: 0.1 10*3/uL (ref 0.0–0.1)
Basophils Relative: 1 %
Eosinophils Absolute: 0.1 10*3/uL (ref 0.0–0.7)
Eosinophils Relative: 2 %
HEMATOCRIT: 43.3 % (ref 39.0–52.0)
Hemoglobin: 14.8 g/dL (ref 13.0–17.0)
LYMPHS ABS: 2.9 10*3/uL (ref 0.7–4.0)
LYMPHS PCT: 35 %
MCH: 30.2 pg (ref 26.0–34.0)
MCHC: 34.2 g/dL (ref 30.0–36.0)
MCV: 88.4 fL (ref 78.0–100.0)
MONO ABS: 0.7 10*3/uL (ref 0.1–1.0)
MONOS PCT: 8 %
NEUTROS ABS: 4.6 10*3/uL (ref 1.7–7.7)
Neutrophils Relative %: 54 %
Platelets: 192 10*3/uL (ref 150–400)
RBC: 4.9 MIL/uL (ref 4.22–5.81)
RDW: 12.9 % (ref 11.5–15.5)
WBC: 8.3 10*3/uL (ref 4.0–10.5)

## 2018-03-17 NOTE — Pre-Procedure Instructions (Signed)
Asked patient who was going to drive him home after surgery, he states, "I don't know, I don't have anyone". Explained importance of having someone with him and not driving for 24 hrs after surgery. He was instructed to call us by to,orrow at 1400 if he could not find anyone to drive him home, because we could not do his surgery without a responsible adult with him. He verbalized understanding.

## 2018-03-21 ENCOUNTER — Ambulatory Visit (HOSPITAL_COMMUNITY)
Admission: RE | Admit: 2018-03-21 | Discharge: 2018-03-21 | Disposition: A | Discharge status: Home / Self Care | Payer: BLUE CROSS/BLUE SHIELD | Source: Ambulatory Visit | Attending: General Surgery | Admitting: General Surgery

## 2018-03-21 ENCOUNTER — Encounter (HOSPITAL_COMMUNITY): Payer: Self-pay | Admitting: *Deleted

## 2018-03-21 ENCOUNTER — Ambulatory Visit (HOSPITAL_COMMUNITY): Payer: BLUE CROSS/BLUE SHIELD | Admitting: Anesthesiology

## 2018-03-21 ENCOUNTER — Encounter (HOSPITAL_COMMUNITY)
Admission: RE | Disposition: A | Discharge status: Home / Self Care | Payer: Self-pay | Source: Ambulatory Visit | Attending: General Surgery

## 2018-03-21 DIAGNOSIS — K649 Unspecified hemorrhoids: Secondary | ICD-10-CM | POA: Diagnosis present

## 2018-03-21 DIAGNOSIS — I10 Essential (primary) hypertension: Secondary | ICD-10-CM | POA: Insufficient documentation

## 2018-03-21 DIAGNOSIS — K648 Other hemorrhoids: Secondary | ICD-10-CM | POA: Diagnosis not present

## 2018-03-21 DIAGNOSIS — K644 Residual hemorrhoidal skin tags: Secondary | ICD-10-CM

## 2018-03-21 HISTORY — PX: HEMORRHOID SURGERY: SHX153

## 2018-03-21 SURGERY — HEMORRHOIDECTOMY
Anesthesia: General | Site: Rectum

## 2018-03-21 MED ORDER — FENTANYL CITRATE (PF) 100 MCG/2ML IJ SOLN: 25.0000 ug | INTRAMUSCULAR | Status: DC | PRN

## 2018-03-21 MED ORDER — FENTANYL CITRATE (PF) 100 MCG/2ML IJ SOLN
INTRAMUSCULAR | Status: AC
  Filled 2018-03-21: qty 4

## 2018-03-21 MED ORDER — SUCCINYLCHOLINE CHLORIDE 20 MG/ML IJ SOLN
INTRAMUSCULAR | Status: AC
  Filled 2018-03-21: qty 1

## 2018-03-21 MED ORDER — MIDAZOLAM HCL 5 MG/5ML IJ SOLN
INTRAMUSCULAR | Status: DC | PRN
  Administered 2018-03-21: 2 mg via INTRAVENOUS

## 2018-03-21 MED ORDER — LACTATED RINGERS IV SOLN
INTRAVENOUS | Status: DC
  Administered 2018-03-21: 1000 mL via INTRAVENOUS

## 2018-03-21 MED ORDER — HYDROCODONE-ACETAMINOPHEN 7.5-325 MG PO TABS
1.0000 | ORAL_TABLET | Freq: Once | ORAL | Status: AC | PRN
  Administered 2018-03-21: 1 via ORAL
  Filled 2018-03-21: qty 1

## 2018-03-21 MED ORDER — CEFOTETAN DISODIUM 2 G IJ SOLR
2.0000 g | INTRAMUSCULAR | Status: AC
  Administered 2018-03-21: 2 g via INTRAVENOUS

## 2018-03-21 MED ORDER — PROPOFOL 10 MG/ML IV BOLUS
INTRAVENOUS | Status: AC
  Filled 2018-03-21: qty 40

## 2018-03-21 MED ORDER — KETOROLAC TROMETHAMINE 30 MG/ML IJ SOLN
INTRAMUSCULAR | Status: AC
  Filled 2018-03-21: qty 1

## 2018-03-21 MED ORDER — LIDOCAINE VISCOUS HCL 2 % MT SOLN
OROMUCOSAL | Status: AC
  Filled 2018-03-21: qty 15

## 2018-03-21 MED ORDER — MIDAZOLAM HCL 2 MG/2ML IJ SOLN
INTRAMUSCULAR | Status: AC
  Filled 2018-03-21: qty 2

## 2018-03-21 MED ORDER — OXYCODONE-ACETAMINOPHEN 7.5-325 MG PO TABS: 1.0000 | ORAL_TABLET | Freq: Four times a day (QID) | ORAL | 0 refills | Status: DC | PRN

## 2018-03-21 MED ORDER — PROPOFOL 10 MG/ML IV BOLUS
INTRAVENOUS | Status: DC | PRN
  Administered 2018-03-21: 150 mg via INTRAVENOUS

## 2018-03-21 MED ORDER — LIDOCAINE VISCOUS HCL 2 % MT SOLN
OROMUCOSAL | Status: DC | PRN
  Administered 2018-03-21: 1

## 2018-03-21 MED ORDER — FENTANYL CITRATE (PF) 100 MCG/2ML IJ SOLN
INTRAMUSCULAR | Status: DC | PRN
  Administered 2018-03-21: 50 ug via INTRAVENOUS

## 2018-03-21 MED ORDER — KETOROLAC TROMETHAMINE 30 MG/ML IJ SOLN
30.0000 mg | Freq: Once | INTRAMUSCULAR | Status: AC
  Administered 2018-03-21: 30 mg via INTRAVENOUS

## 2018-03-21 MED ORDER — SODIUM CHLORIDE 0.9 % IV SOLN
INTRAVENOUS | Status: AC
  Filled 2018-03-21: qty 2

## 2018-03-21 MED ORDER — PHENYLEPHRINE 40 MCG/ML (10ML) SYRINGE FOR IV PUSH (FOR BLOOD PRESSURE SUPPORT)
PREFILLED_SYRINGE | INTRAVENOUS | Status: AC
  Filled 2018-03-21: qty 10

## 2018-03-21 MED ORDER — ONDANSETRON HCL 4 MG/2ML IJ SOLN
INTRAMUSCULAR | Status: AC
  Filled 2018-03-21: qty 2

## 2018-03-21 MED ORDER — CHLORHEXIDINE GLUCONATE CLOTH 2 % EX PADS: 6.0000 | MEDICATED_PAD | Freq: Once | CUTANEOUS | Status: DC

## 2018-03-21 MED ORDER — BUPIVACAINE LIPOSOME 1.3 % IJ SUSP
INTRAMUSCULAR | Status: DC | PRN
  Administered 2018-03-21: 20 mL

## 2018-03-21 MED ORDER — BUPIVACAINE LIPOSOME 1.3 % IJ SUSP
INTRAMUSCULAR | Status: AC
  Filled 2018-03-21: qty 20

## 2018-03-21 MED ORDER — ONDANSETRON HCL 4 MG/2ML IJ SOLN
INTRAMUSCULAR | Status: DC | PRN
  Administered 2018-03-21: 4 mg via INTRAVENOUS

## 2018-03-21 SURGICAL SUPPLY — 26 items
CLOTH BEACON ORANGE TIMEOUT ST (SAFETY) ×3 IMPLANT
COVER LIGHT HANDLE STERIS (MISCELLANEOUS) ×6 IMPLANT
DRAPE HALF SHEET 40X57 (DRAPES) ×3 IMPLANT
DRAPE PROXIMA HALF (DRAPES) ×3 IMPLANT
ELECT REM PT RETURN 9FT ADLT (ELECTROSURGICAL) ×3
ELECTRODE REM PT RTRN 9FT ADLT (ELECTROSURGICAL) ×1 IMPLANT
GAUZE SPONGE 4X4 12PLY STRL (GAUZE/BANDAGES/DRESSINGS) ×6 IMPLANT
GLOVE BIOGEL PI IND STRL 7.0 (GLOVE) ×1 IMPLANT
GLOVE BIOGEL PI INDICATOR 7.0 (GLOVE) ×2
GLOVE SURG SS PI 7.5 STRL IVOR (GLOVE) ×3 IMPLANT
GOWN STRL REUS W/ TWL XL LVL3 (GOWN DISPOSABLE) ×1 IMPLANT
GOWN STRL REUS W/TWL LRG LVL3 (GOWN DISPOSABLE) ×3 IMPLANT
GOWN STRL REUS W/TWL XL LVL3 (GOWN DISPOSABLE) ×2
HEMOSTAT SURGICEL 4X8 (HEMOSTASIS) ×3 IMPLANT
KIT TURNOVER CYSTO (KITS) ×3 IMPLANT
LIGASURE IMPACT 36 18CM CVD LR (INSTRUMENTS) ×3 IMPLANT
MANIFOLD NEPTUNE II (INSTRUMENTS) ×3 IMPLANT
NEEDLE HYPO 22GX1.5 SAFETY (NEEDLE) ×3 IMPLANT
NS IRRIG 1000ML POUR BTL (IV SOLUTION) ×3 IMPLANT
PACK PERI GYN (CUSTOM PROCEDURE TRAY) ×3 IMPLANT
PAD ARMBOARD 7.5X6 YLW CONV (MISCELLANEOUS) ×3 IMPLANT
SET BASIN LINEN APH (SET/KITS/TRAYS/PACK) ×3 IMPLANT
SURGILUBE 3G PEEL PACK STRL (MISCELLANEOUS) ×3 IMPLANT
SUT SILK 0 FSL (SUTURE) ×3 IMPLANT
SUT VIC AB 2-0 CT2 27 (SUTURE) ×3 IMPLANT
SYR 20CC LL (SYRINGE) IMPLANT

## 2018-03-21 NOTE — Anesthesia Procedure Notes (Signed)
Procedure Name: LMA Insertion Date/Time: 03/21/2018 7:45 AM Performed by: Despina HiddenIdacavage, Briahnna Harries J, CRNA Pre-anesthesia Checklist: Patient identified, Patient being monitored, Emergency Drugs available, Timeout performed and Suction available Patient Re-evaluated:Patient Re-evaluated prior to induction Oxygen Delivery Method: Circle System Utilized Preoxygenation: Pre-oxygenation with 100% oxygen Induction Type: IV induction Ventilation: Mask ventilation without difficulty LMA: LMA inserted LMA Size: 4.0 Number of attempts: 1 Placement Confirmation: positive ETCO2 and breath sounds checked- equal and bilateral Tube secured with: Tape Dental Injury: Teeth and Oropharynx as per pre-operative assessment

## 2018-03-21 NOTE — Interval H&P Note (Signed)
History and Physical Interval Note:  03/21/2018 7:12 AM  [name]  has presented today for surgery, with the diagnosis of bleeding hemorrhoids  The various methods of treatment have been discussed with the patient and family. After consideration of risks, benefits and other options for treatment, the patient has consented to  Procedure(s): EXTENSIVE HEMORRHOIDECTOMY (N/A) as a surgical intervention .  The patient's history has been reviewed, patient examined, no change in status, stable for surgery.  I have reviewed the patient's chart and labs.  Questions were answered to the patient's satisfaction.     Franky MachoMark Trejuan Matherne

## 2018-03-21 NOTE — Anesthesia Postprocedure Evaluation (Signed)
Anesthesia Post Note  Patient: Richard Kerr  Procedure(s) Performed: EXTENSIVE HEMORRHOIDECTOMY (N/A Rectum)  Patient location during evaluation: PACU Anesthesia Type: General Level of consciousness: awake and patient cooperative Pain management: pain level controlled Vital Signs Assessment: post-procedure vital signs reviewed and stable Respiratory status: spontaneous breathing, nonlabored ventilation and respiratory function stable Cardiovascular status: blood pressure returned to baseline Postop Assessment: no apparent nausea or vomiting Anesthetic complications: no     Last Vitals:  Vitals:   03/21/18 0715 03/21/18 0823  BP:  128/73  Pulse:  84  Resp: 18 20  Temp:  36.7 C  SpO2: 99% 96%    Last Pain:  Vitals:   03/21/18 0823  TempSrc:   PainSc: Asleep                 Modupe Shampine J

## 2018-03-21 NOTE — Anesthesia Preprocedure Evaluation (Signed)
Anesthesia Evaluation  Patient identified by MRN, date of birth, ID band Patient awake  General Assessment Comment:Took metoprolol today at 5am with h2o  Reviewed: Allergy & Precautions, NPO status , Patient's Chart, lab work & pertinent test results, reviewed documented beta blocker date and time   Airway Mallampati: II  TM Distance: >3 FB Neck ROM: Full    Dental no notable dental hx. (+) Teeth Intact   Pulmonary neg pulmonary ROS,    Pulmonary exam normal breath sounds clear to auscultation       Cardiovascular Exercise Tolerance: Good hypertension, negative cardio ROS Normal cardiovascular examI Rhythm:Regular Rate:Normal     Neuro/Psych Anxiety negative neurological ROS  negative psych ROS   GI/Hepatic negative GI ROS, Neg liver ROS, GERD  ,Denies Sx today   Endo/Other  negative endocrine ROS  Renal/GU negative Renal ROS  negative genitourinary   Musculoskeletal negative musculoskeletal ROS (+)   Abdominal   Peds negative pediatric ROS (+)  Hematology negative hematology ROS (+)   Anesthesia Other Findings   Reproductive/Obstetrics negative OB ROS                             Anesthesia Physical Anesthesia Plan  ASA: II  Anesthesia Plan: General   Post-op Pain Management:    Induction: Intravenous  PONV Risk Score and Plan:   Airway Management Planned: LMA  Additional Equipment:   Intra-op Plan:   Post-operative Plan:   Informed Consent: I have reviewed the patients History and Physical, chart, labs and discussed the procedure including the risks, benefits and alternatives for the proposed anesthesia with the patient or authorized representative who has indicated his/her understanding and acceptance.   Dental advisory given  Plan Discussed with: CRNA  Anesthesia Plan Comments:         Anesthesia Quick Evaluation

## 2018-03-21 NOTE — Op Note (Signed)
Patient:  Richard HoseDaryl K Kerr  DOB:  Feb 16, 1961  MRN:  [id number]   Preop Diagnosis: Bleeding hemorrhoidal disease  Postop Diagnosis: Same  Procedure: Extensive hemorrhoidectomy  Surgeon: Franky MachoMark Amariyana Heacox, MD  Anes: General  Indications: Patient is a 57 year old white male who was referred to my care for bleeding hemorrhoidal disease.  The risks and benefits of the procedure including bleeding, infection, recurrence of the hemorrhoidal disease were fully explained to the patient, who gave informed consent.  Procedure note: The patient was placed in lithotomy position after general anesthesia was administered.  The perineum was prepped and draped using the usual sterile technique with Betadine.  Surgical site confirmation was performed.  On rectal examination, the patient had 2 columns of internal and external hemorrhoids at the 7:00 and 11:00 positions.  No internal hemorrhoid was noted at the 4 o'clock position.  All were removed in a column-like fashion using the LigaSure.  Care was taken to avoid the external sphincter mechanism.  No abnormal bleeding was noted at the end of the procedure.  Exparel was instilled into the surrounding perineum.  Surgicel and Viscous Xylocaine rectal packing was placed.  All tape and needle counts were correct at the end of the procedure.  The patient was awakened and transferred to PACU in stable condition.  Complications: None  EBL: Minimal  Specimen: Hemorrhoids

## 2018-03-21 NOTE — Transfer of Care (Signed)
Immediate Anesthesia Transfer of Care Note  Patient: Richard Kerr  Procedure(s) Performed: EXTENSIVE HEMORRHOIDECTOMY (N/A Rectum)  Patient Location: PACU  Anesthesia Type:General  Level of Consciousness: drowsy and patient cooperative  Airway & Oxygen Therapy: Patient Spontanous Breathing and Patient connected to face mask oxygen  Post-op Assessment: Report given to RN, Post -op Vital signs reviewed and stable and Patient moving all extremities  Post vital signs: Reviewed and stable  Last Vitals:  Vitals Value Taken Time  BP    Temp    Pulse    Resp    SpO2      Last Pain:  Vitals:   03/21/18 0639  TempSrc: Oral  PainSc: 0-No pain      Patients Stated Pain Goal: 8 (03/21/18 [id number])  Complications: No apparent anesthesia complications

## 2018-03-21 NOTE — Discharge Instructions (Signed)
Surgical Procedures for Hemorrhoids, Care After °Refer to this sheet in the next few weeks. These instructions provide you with information about caring for yourself after your procedure. Your health care provider may also give you more specific instructions. Your treatment has been planned according to current medical practices, but problems sometimes occur. Call your health care provider if you have any problems or questions after your procedure. °What can I expect after the procedure? °After the procedure, it is common to have: °· Rectal pain. °· Pain when you are having a bowel movement. °· Slight rectal bleeding. ° °Follow these instructions at home: °Medicines °· Take over-the-counter and prescription medicines only as told by your health care provider. °· Do not drive or operate heavy machinery while taking prescription pain medicine. °· Use a stool softener or a bulk laxative as told by your health care provider. °Activity °· Rest at home. Return to your normal activities as told by your health care provider. °· Do not lift anything that is heavier than 10 lb (4.5 kg). °· Do not sit for long periods of time. Take a walk every day or as told by your health care provider. °· Do not strain to have a bowel movement. Do not spend a long time sitting on the toilet. °Eating and drinking °· Eat foods that contain fiber, such as whole grains, beans, nuts, fruits, and vegetables. °· Drink enough fluid to keep your urine clear or pale yellow. °General instructions °· Sit in a warm bath 2-3 times per day to relieve soreness or itching. °· Keep all follow-up visits as told by your health care provider. This is important. °Contact a health care provider if: °· Your pain medicine is not helping. °· You have a fever or chills. °· You become constipated. °· You have trouble passing urine. °Get help right away if: °· You have very bad rectal pain. °· You have heavy bleeding from your rectum. °This information is not intended  to replace advice given to you by your health care provider. Make sure you discuss any questions you have with your health care provider. °Document Released: 10/03/2003 Document Revised: 12/19/2015 Document Reviewed: 10/08/2014 °Elsevier Interactive Patient Education © 2018 [employer] ° ° °General Anesthesia, Adult, Care After °These instructions provide you with information about caring for yourself after your procedure. Your health care provider may also give you more specific instructions. Your treatment has been planned according to current medical practices, but problems sometimes occur. Call your health care provider if you have any problems or questions after your procedure. °What can I expect after the procedure? °After the procedure, it is common to have: °· Vomiting. °· A sore throat. °· Mental slowness. ° °It is common to feel: °· Nauseous. °· Cold or shivery. °· Sleepy. °· Tired. °· Sore or achy, even in parts of your body where you did not have surgery. ° °Follow these instructions at home: °For at least 24 hours after the procedure: °· Do not: °? Participate in activities where you could fall or become injured. °? Drive. °? Use heavy machinery. °? Drink alcohol. °? Take sleeping pills or medicines that cause drowsiness. °? Make important decisions or sign legal documents. °? Take care of children on your own. °· Rest. °Eating and drinking °· If you vomit, drink water, juice, or soup when you can drink without vomiting. °· Drink enough fluid to keep your urine clear or pale yellow. °· Make sure you have little or no nausea before eating   solid foods. °· Follow the diet recommended by your health care provider. °General instructions °· Have a responsible adult stay with you until you are awake and alert. °· Return to your normal activities as told by your health care provider. Ask your health care provider what activities are safe for you. °· Take over-the-counter and prescription medicines only as  told by your health care provider. °· If you smoke, do not smoke without supervision. °· Keep all follow-up visits as told by your health care provider. This is important. °Contact a health care provider if: °· You continue to have nausea or vomiting at home, and medicines are not helpful. °· You cannot drink fluids or start eating again. °· You cannot urinate after 8-12 hours. °· You develop a skin rash. °· You have fever. °· You have increasing redness at the site of your procedure. °Get help right away if: °· You have difficulty breathing. °· You have chest pain. °· You have unexpected bleeding. °· You feel that you are having a life-threatening or urgent problem. °This information is not intended to replace advice given to you by your health care provider. Make sure you discuss any questions you have with your health care provider. °Document Released: 10/19/2000 Document Revised: 12/16/2015 Document Reviewed: 06/27/2015 °Elsevier Interactive Patient Education © 2018 [employer] ° °

## 2018-03-22 ENCOUNTER — Encounter (HOSPITAL_COMMUNITY): Payer: Self-pay | Admitting: General Surgery

## 2018-03-29 ENCOUNTER — Encounter: Payer: Self-pay | Admitting: General Surgery

## 2018-03-29 ENCOUNTER — Ambulatory Visit (INDEPENDENT_AMBULATORY_CARE_PROVIDER_SITE_OTHER): Payer: Self-pay | Admitting: General Surgery

## 2018-03-29 VITALS — BP 135/102 | HR 69 | Temp 98.6°F | Resp 20 | Wt 196.0 lb

## 2018-03-29 DIAGNOSIS — Z09 Encounter for follow-up examination after completed treatment for conditions other than malignant neoplasm: Secondary | ICD-10-CM

## 2018-03-29 MED ORDER — OXYCODONE-ACETAMINOPHEN 7.5-325 MG PO TABS: 1.0000 | ORAL_TABLET | Freq: Four times a day (QID) | ORAL | 0 refills | Status: AC | PRN

## 2018-03-29 NOTE — Progress Notes (Signed)
Subjective:     Richard Kerr  S/p hemorrhoidectomy.  Having some moderate rectal pain with bowel movements.  Occasional blood noted on the toilet paper when he wiped himself. Objective:    BP (!) 135/102 (BP Location: Left Arm, Patient Position: Sitting, Cuff Size: Normal)   Pulse 69   Temp 98.6 F (37 C) (Temporal)   Resp 20   Wt 196 lb (88.9 kg)   BMI 26.58 kg/m   General:  alert, cooperative and no distress  Rectum healing well.  No active bleeding noted. Final pathology consistent with diagnosis.     Assessment:    Doing well postoperatively.    Plan:   Percocet reordered for pain.  We will see patient again in 1 week for follow-up prior to release for work.  Avoid constipation and straining.  Reassured patient that he was progressing well.

## 2018-04-05 ENCOUNTER — Ambulatory Visit (INDEPENDENT_AMBULATORY_CARE_PROVIDER_SITE_OTHER): Payer: Self-pay | Admitting: General Surgery

## 2018-04-05 ENCOUNTER — Encounter: Payer: Self-pay | Admitting: General Surgery

## 2018-04-05 VITALS — BP 145/86 | HR 59 | Temp 97.7°F | Resp 22 | Wt 199.0 lb

## 2018-04-05 DIAGNOSIS — Z09 Encounter for follow-up examination after completed treatment for conditions other than malignant neoplasm: Secondary | ICD-10-CM

## 2018-04-05 NOTE — Progress Notes (Signed)
Subjective:     Richard Kerr  Here for follow-up.  Patient states he has had resolving rectal pain and bleeding.  He is improved since last visit. Objective:    BP (!) 145/86 (BP Location: Left Arm, Patient Position: Sitting, Cuff Size: Normal)   Pulse (!) 59   Temp 97.7 F (36.5 C) (Temporal)   Resp (!) 22   Wt 199 lb (90.3 kg)   BMI 26.99 kg/m   General:  alert, cooperative and no distress       Assessment:    Doing well postoperatively.    Plan:   I told patient that both the rectal pain and bleeding will resolve with time.  He understands and agrees.  Follow-up PRN.

## 2018-11-22 IMAGING — CT CT RENAL STONE PROTOCOL
2 of 3 series · 16 of 46 positions shown, 18 images · non-contrast
Comparison: KUB 08/18/2013

CLINICAL DATA: Right flank pain

EXAM:
CT ABDOMEN AND PELVIS WITHOUT CONTRAST
TECHNIQUE: Multidetector CT imaging of the abdomen and pelvis was performed
following the standard protocol without IV contrast.

[Series 2: axial st · axial · 0.85mm/px · z∈[+1103,+1578]mm · 13 of 109 slices shown, 15 images]
[im 7/109  soft-tissue]
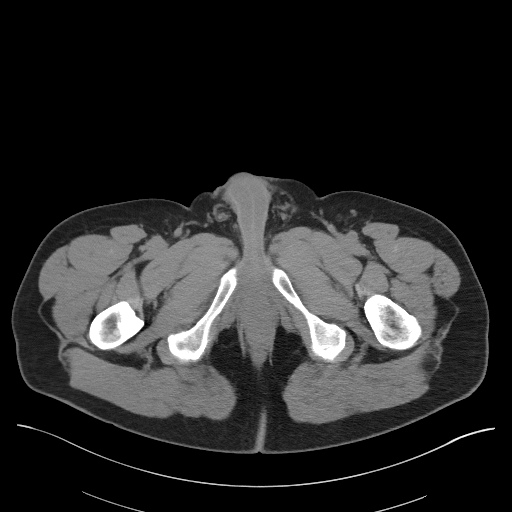
[im 7/109  bone]
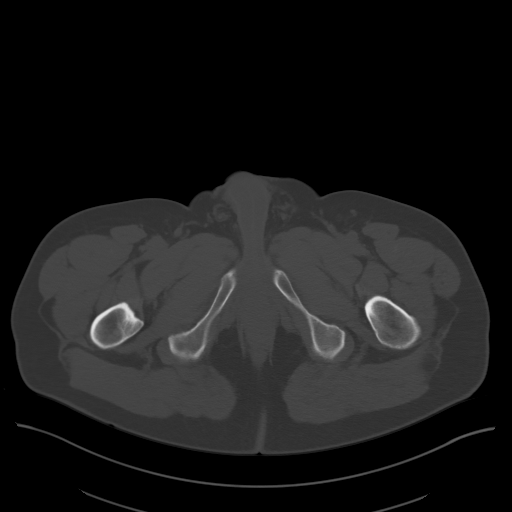
[im 14/109  soft-tissue]
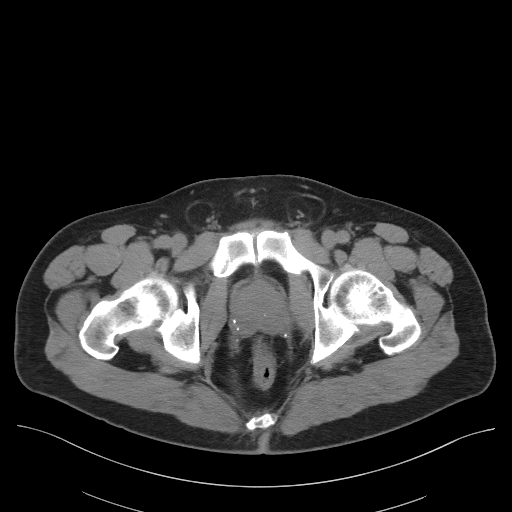
[im 21/109  soft-tissue]
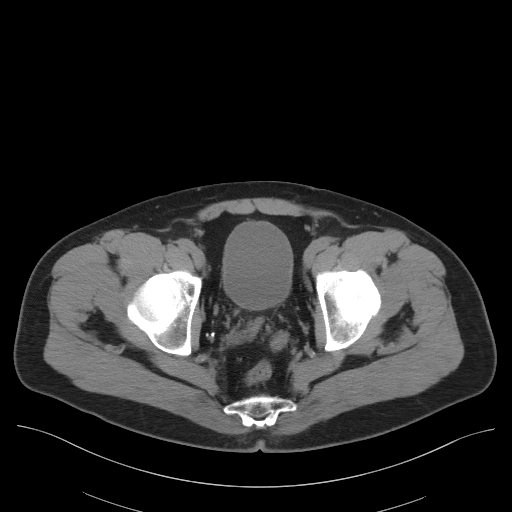
[im 32/109  soft-tissue]
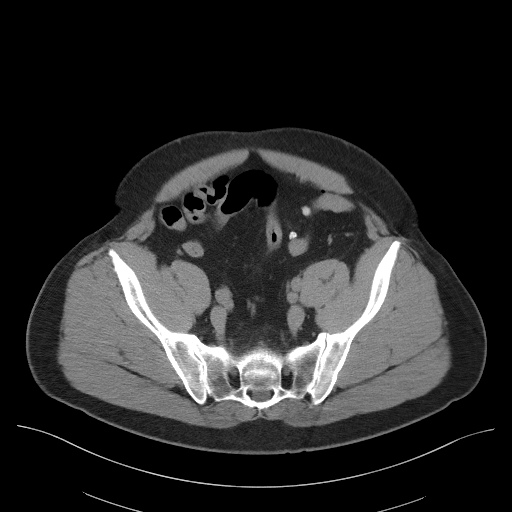
[im 39/109  soft-tissue]
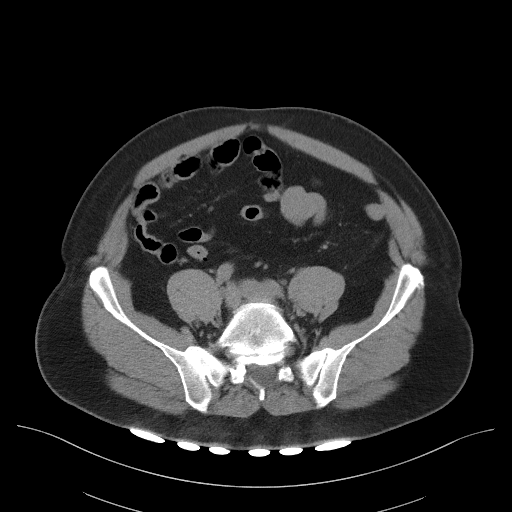
[im 46/109  soft-tissue]
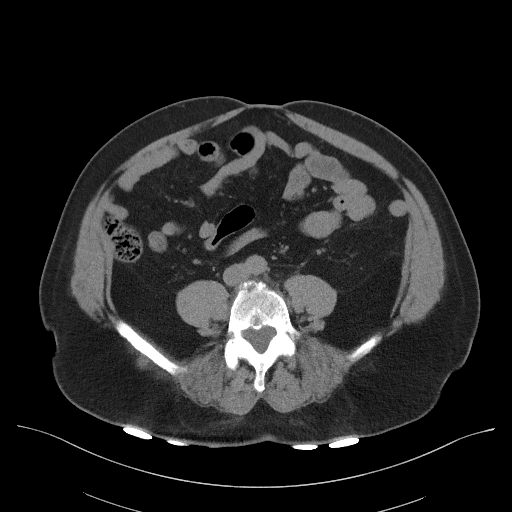
[im 56/109  soft-tissue]
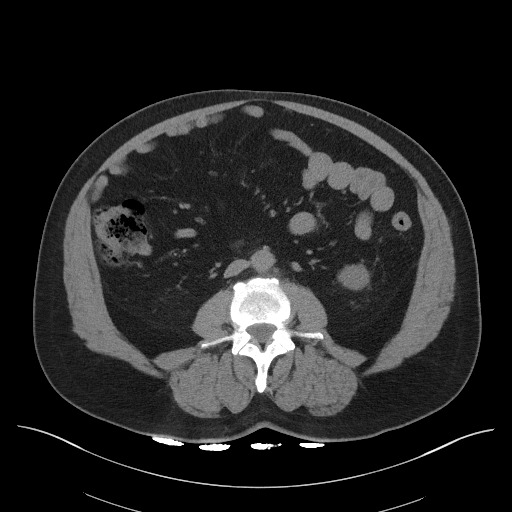
[im 63/109  soft-tissue]
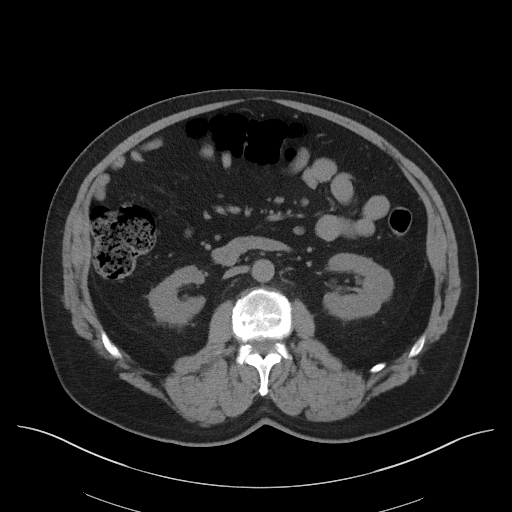
[im 70/109  soft-tissue]
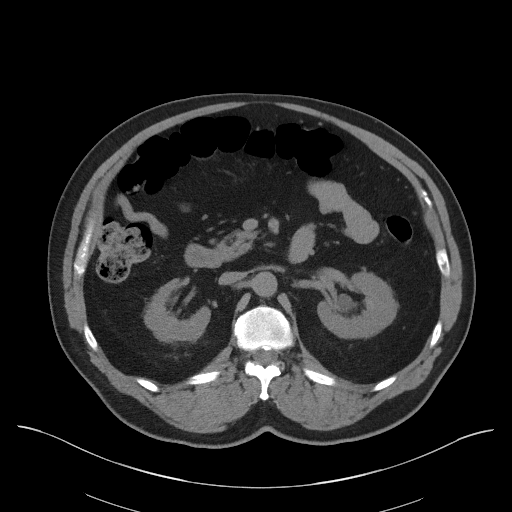
[im 70/109  bone]
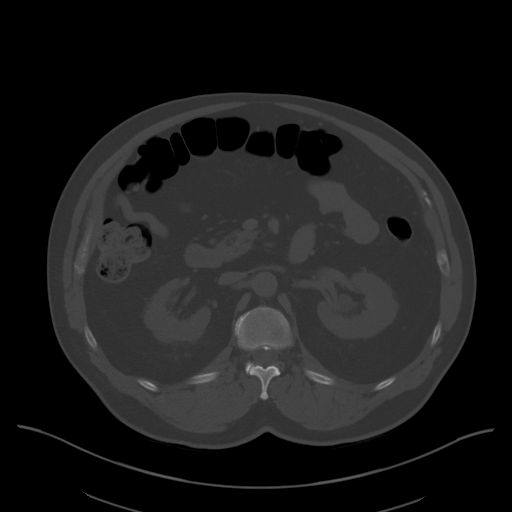
[im 77/109  soft-tissue]
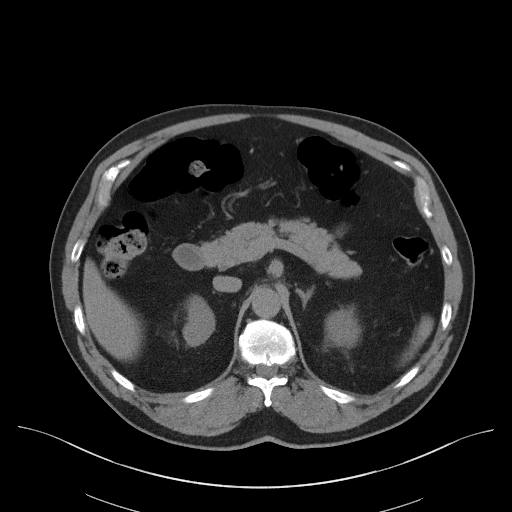
[im 88/109  soft-tissue]
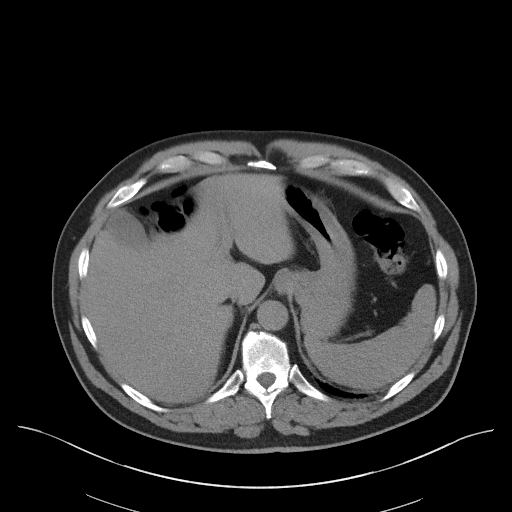
[im 95/109  soft-tissue]
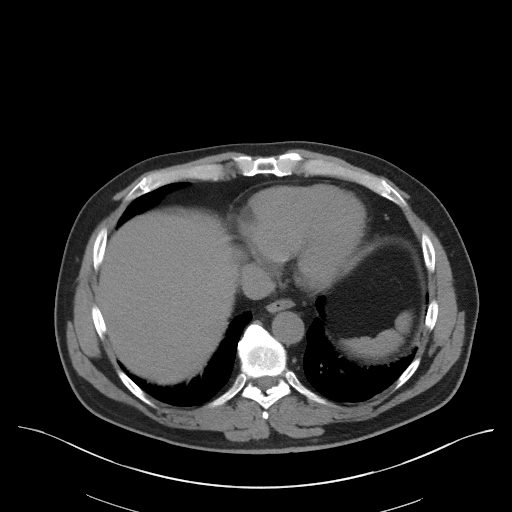
[im 102/109  soft-tissue]
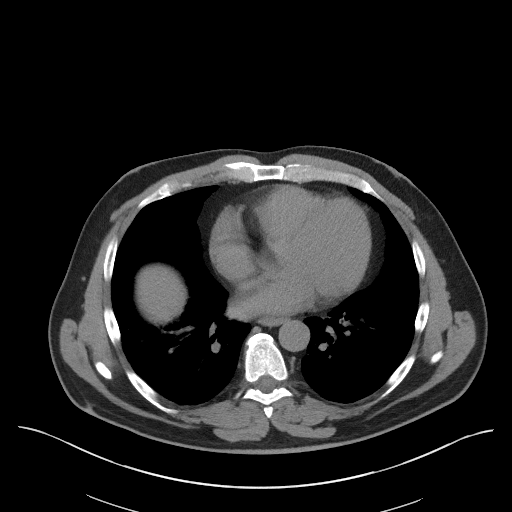

[Series 5: coronal st · coronal · 0.76mm/px · 3 of 99 slices shown]
[im 33/99  soft-tissue]
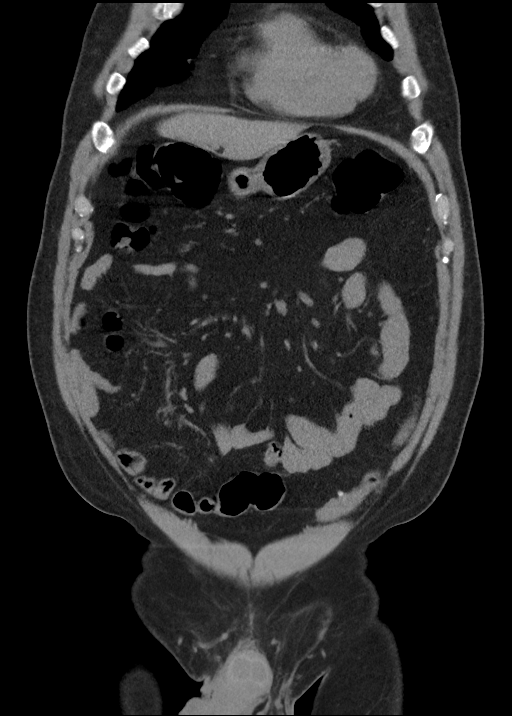
[im 44/99  soft-tissue]
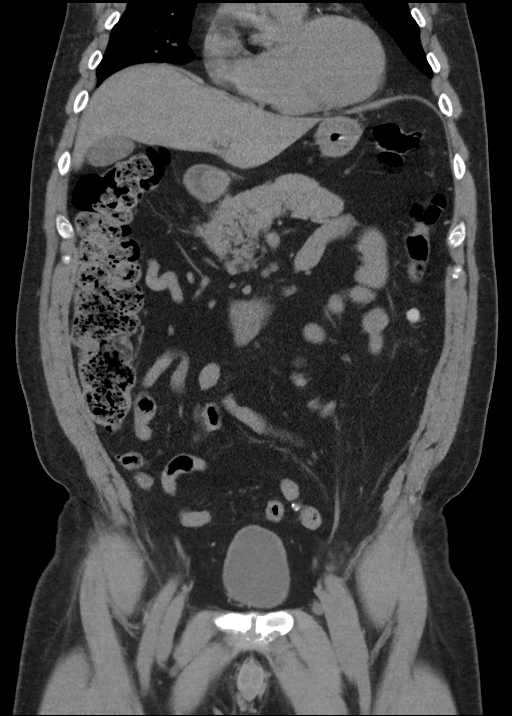
[im 55/99  soft-tissue]
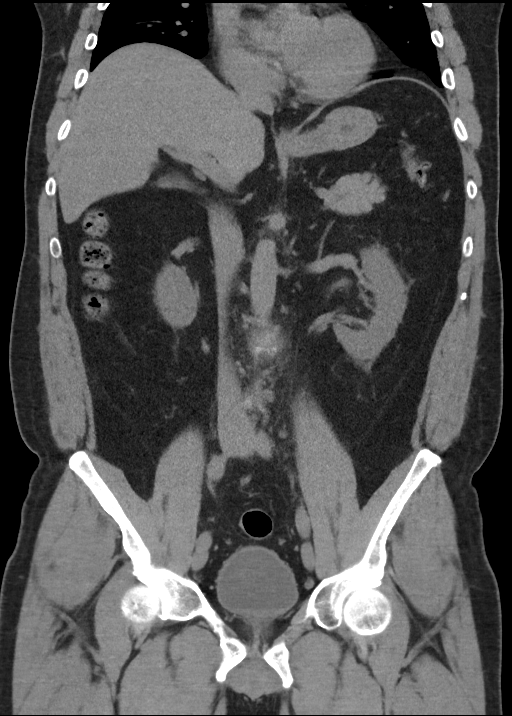

[16 of 46 positions shown; findings below may reference images not displayed]

FINDINGS: Lower chest: Mild bibasilar atelectasis.

Hepatobiliary: Normal liver.  Gallbladder and bile ducts normal

Pancreas: Negative

Spleen: Negative

Adrenals/Urinary Tract: Negative for urinary tract calculi. No renal
obstruction or mass. Urinary bladder normal.

Stomach/Bowel: Negative for bowel obstruction. Stomach and small
bowel normal. No bowel wall edema or mass. Normal appendix.

Vascular/Lymphatic: Negative

Reproductive: Enlarged prostate measuring 43 x 44 mm.

Other: No free fluid. Negative for mass. Inguinal hernia containing
fat bilaterally.

Musculoskeletal: Mild lumbar spine degenerative changes. No acute
skeletal abnormality.
IMPRESSION: No acute abnormality and negative for urinary tract calculi

Normal appendix

Enlarged prostate

Bilateral small inguinal hernia containing fat
